# Patient Record
Sex: Female | Born: 1980 | Race: White | Hispanic: No | Marital: Married | Smoking: Never smoker
Health system: Southern US, Community
[De-identification: ages and names within clinical notes are randomized; demographics above are authoritative.]

## PROBLEM LIST (undated history)

## (undated) DIAGNOSIS — K219 Gastro-esophageal reflux disease without esophagitis: Secondary | ICD-10-CM

## (undated) DIAGNOSIS — IMO0002 Reserved for concepts with insufficient information to code with codable children: Secondary | ICD-10-CM

## (undated) DIAGNOSIS — R87619 Unspecified abnormal cytological findings in specimens from cervix uteri: Secondary | ICD-10-CM

## (undated) DIAGNOSIS — K5900 Constipation, unspecified: Secondary | ICD-10-CM

## (undated) DIAGNOSIS — K649 Unspecified hemorrhoids: Secondary | ICD-10-CM

## (undated) DIAGNOSIS — K589 Irritable bowel syndrome without diarrhea: Secondary | ICD-10-CM

## (undated) DIAGNOSIS — B009 Herpesviral infection, unspecified: Secondary | ICD-10-CM

## (undated) HISTORY — PX: HEMORRHOID BANDING: SHX5850

## (undated) HISTORY — PX: COLPOSCOPY: SHX161

## (undated) HISTORY — PX: WISDOM TOOTH EXTRACTION: SHX21

---

## 1999-10-11 ENCOUNTER — Encounter: Admission: RE | Admit: 1999-10-11 | Discharge: 2000-01-09 | Payer: Self-pay | Admitting: Unknown Physician Specialty

## 2000-02-08 ENCOUNTER — Other Ambulatory Visit: Admission: RE | Admit: 2000-02-08 | Discharge: 2000-02-08 | Payer: Self-pay | Admitting: Obstetrics & Gynecology

## 2001-10-17 ENCOUNTER — Other Ambulatory Visit: Admission: RE | Admit: 2001-10-17 | Discharge: 2001-10-17 | Payer: Self-pay | Admitting: Obstetrics & Gynecology

## 2002-10-30 ENCOUNTER — Other Ambulatory Visit: Admission: RE | Admit: 2002-10-30 | Discharge: 2002-10-30 | Payer: Self-pay | Admitting: Obstetrics & Gynecology

## 2003-05-26 ENCOUNTER — Other Ambulatory Visit: Admission: RE | Admit: 2003-05-26 | Discharge: 2003-05-26 | Payer: Self-pay | Admitting: Obstetrics & Gynecology

## 2003-11-14 ENCOUNTER — Other Ambulatory Visit: Admission: RE | Admit: 2003-11-14 | Discharge: 2003-11-14 | Payer: Self-pay | Admitting: Obstetrics & Gynecology

## 2004-11-30 ENCOUNTER — Other Ambulatory Visit: Admission: RE | Admit: 2004-11-30 | Discharge: 2004-11-30 | Payer: Self-pay | Admitting: Obstetrics & Gynecology

## 2006-01-25 ENCOUNTER — Other Ambulatory Visit: Admission: RE | Admit: 2006-01-25 | Discharge: 2006-01-25 | Payer: Self-pay | Admitting: Obstetrics & Gynecology

## 2009-07-10 ENCOUNTER — Inpatient Hospital Stay (HOSPITAL_COMMUNITY): Admission: AD | Admit: 2009-07-10 | Discharge: 2009-07-12 | Payer: Self-pay | Admitting: Obstetrics and Gynecology

## 2011-03-13 LAB — CBC
HCT: 31.5 % — ABNORMAL LOW (ref 36.0–46.0)
HCT: 38 % (ref 36.0–46.0)
Hemoglobin: 13 g/dL (ref 12.0–15.0)
MCHC: 34.3 g/dL (ref 30.0–36.0)
MCV: 100.2 fL — ABNORMAL HIGH (ref 78.0–100.0)
MCV: 101 fL — ABNORMAL HIGH (ref 78.0–100.0)
Platelets: 185 10*3/uL (ref 150–400)
RBC: 3.8 MIL/uL — ABNORMAL LOW (ref 3.87–5.11)
RDW: 11.9 % (ref 11.5–15.5)

## 2011-04-22 NOTE — Discharge Summary (Signed)
Bianca Webster, [place] NO.:  [id number]   MEDICAL RECORD NO.:  [id number]          PATIENT TYPE:  INP   LOCATION:  9142                          FACILITY:  [facility]   PHYSICIAN:  Malva Limes, M.D.    DATE OF BIRTH:  [birth year]   DATE OF ADMISSION:  07/10/2009  DATE OF DISCHARGE:  07/12/2009                               DISCHARGE SUMMARY   FINAL DIAGNOSES:  1. Intrauterine pregnancy at 59 weeks' gestation.  2. Active labor.  3. Episode of fetal bradycardia during the second stage of labor.   PROCEDURE:  Vacuum-assisted vaginal delivery of a female infant with  Apgars of 8 and 9.   DELIVERY PERFORMED:  Malva Limes, M.D.   COMPLICATIONS:  None.   HOSPITAL COURSE:  This 30 year old G1, P0 presents at 38 weeks'  gestation in labor.  The patient's antepartum course up to this point  has been uncomplicated.  The patient was admitted later on the evening  of August 6.  Dr. Dareen Piano was called by the nursing service seconds in  bradycardia.  By the time Dr. Dareen Piano saw the strip, the fetal heart  tones returned to normal.  The patient had AROM, dilated to complete and  complete.  The patient began pushing.  She was having some variable  decelerations with pushing.  At this point, a vacuum extractor was  placed at +3 station.  The patient delivered with the first contraction.  She had the delivery of a 6-pound 5-ounce female infant with Apgars of 8  and 9.  There was a second-degree midline tear that was repaired.  Delivery went without complications.  The patient's postpartum course  was benign without any significant fevers.  The patient was felt ready  for discharge on postpartum day #2.  She was sent home on a regular  diet, told to decrease activities, told to continue her prenatal  vitamins, was sent home with Percocet 1-2 every 4 hours as needed for  pain, and told to continue her vitamins, was to follow up in our office  in 4 weeks.   LABORATORY DATA:  On  discharge, the patient had a hemoglobin of 11.0,  white blood cell count of 15.1. and platelets of 185,000.      [name], P.A.-C.    ______________________________  Malva Limes, M.D.    MB/MEDQ  D:  07/27/2009  T:  07/28/2009  Job:  [id number]

## 2012-09-25 LAB — OB RESULTS CONSOLE ANTIBODY SCREEN: Antibody Screen: NEGATIVE

## 2012-09-25 LAB — OB RESULTS CONSOLE GC/CHLAMYDIA: Chlamydia: NEGATIVE

## 2012-09-25 LAB — OB RESULTS CONSOLE ABO/RH: RH Type: POSITIVE

## 2012-09-25 LAB — OB RESULTS CONSOLE RPR: RPR: NONREACTIVE

## 2012-12-05 NOTE — L&D Delivery Note (Addendum)
Delivery Note At 6:10 AM a viable, healthy and 33 week premature female was delivered via Vaginal, Spontaneous Delivery (Presentation: Occiput Anterior).  APGAR: 8, 9; weight 4 lbs 14 oz..   Placenta status: Intact, Spontaneous, sent to pathology.  Cord:  3 vessels.  Anesthesia: Epidural  Episiotomy: None Lacerations: None, 2 small skin tags excised at patient request. Est. Blood Loss (mL): 300  Mom to postpartum.  Baby to NICU for observation.  Jawanna Dykman D 02/20/2013, 6:30 AM

## 2013-02-19 ENCOUNTER — Inpatient Hospital Stay (HOSPITAL_COMMUNITY)
Admission: AD | Admit: 2013-02-19 | Discharge: 2013-02-22 | DRG: 372 | Disposition: A | Discharge status: Home / Self Care | Payer: BC Managed Care – PPO | Source: Ambulatory Visit | Attending: Obstetrics & Gynecology | Admitting: Obstetrics & Gynecology

## 2013-02-19 ENCOUNTER — Inpatient Hospital Stay (HOSPITAL_COMMUNITY): Payer: BC Managed Care – PPO

## 2013-02-19 ENCOUNTER — Encounter (HOSPITAL_COMMUNITY): Payer: Self-pay | Admitting: *Deleted

## 2013-02-19 DIAGNOSIS — L909 Atrophic disorder of skin, unspecified: Secondary | ICD-10-CM | POA: Diagnosis present

## 2013-02-19 DIAGNOSIS — O4290 Premature rupture of membranes, unspecified as to length of time between rupture and onset of labor, unspecified weeks of gestation: Principal | ICD-10-CM | POA: Diagnosis present

## 2013-02-19 DIAGNOSIS — O99892 Other specified diseases and conditions complicating childbirth: Secondary | ICD-10-CM | POA: Diagnosis present

## 2013-02-19 HISTORY — DX: Reserved for concepts with insufficient information to code with codable children: IMO0002

## 2013-02-19 HISTORY — DX: Unspecified abnormal cytological findings in specimens from cervix uteri: R87.619

## 2013-02-19 HISTORY — DX: Constipation, unspecified: K59.00

## 2013-02-19 HISTORY — DX: Herpesviral infection, unspecified: B00.9

## 2013-02-19 HISTORY — DX: Irritable bowel syndrome, unspecified: K58.9

## 2013-02-19 HISTORY — DX: Unspecified hemorrhoids: K64.9

## 2013-02-19 HISTORY — DX: Gastro-esophageal reflux disease without esophagitis: K21.9

## 2013-02-19 LAB — CBC
HCT: 32.6 % — ABNORMAL LOW (ref 36.0–46.0)
MCH: 33.2 pg (ref 26.0–34.0)
MCV: 99.4 fL (ref 78.0–100.0)
Platelets: 207 10*3/uL (ref 150–400)
RDW: 12.5 % (ref 11.5–15.5)

## 2013-02-19 LAB — OB RESULTS CONSOLE GBS: GBS: NEGATIVE

## 2013-02-19 MED ORDER — DOCUSATE SODIUM 100 MG PO CAPS
100.0000 mg | ORAL_CAPSULE | Freq: Every day | ORAL | Status: DC
  Administered 2013-02-19: 100 mg via ORAL
  Filled 2013-02-19 (×2): qty 1

## 2013-02-19 MED ORDER — TERBUTALINE SULFATE 1 MG/ML IJ SOLN
INTRAMUSCULAR | Status: AC
  Filled 2013-02-19: qty 1

## 2013-02-19 MED ORDER — TERBUTALINE SULFATE 1 MG/ML IJ SOLN
0.2500 mg | Freq: Once | INTRAMUSCULAR | Status: AC
  Administered 2013-02-20: 0.25 mg via SUBCUTANEOUS

## 2013-02-19 MED ORDER — CALCIUM CARBONATE ANTACID 500 MG PO CHEW
2.0000 | CHEWABLE_TABLET | ORAL | Status: DC | PRN
  Filled 2013-02-19: qty 2

## 2013-02-19 MED ORDER — SODIUM CHLORIDE 0.9 % IV SOLN
2.0000 g | Freq: Four times a day (QID) | INTRAVENOUS | Status: DC
  Administered 2013-02-19 – 2013-02-20 (×4): 2 g via INTRAVENOUS
  Filled 2013-02-19 (×6): qty 2000

## 2013-02-19 MED ORDER — BUTORPHANOL TARTRATE 1 MG/ML IJ SOLN
INTRAMUSCULAR | Status: AC
  Filled 2013-02-19: qty 1

## 2013-02-19 MED ORDER — BUTORPHANOL TARTRATE 1 MG/ML IJ SOLN: 1.0000 mg | Freq: Once | INTRAMUSCULAR | Status: DC

## 2013-02-19 MED ORDER — AMOXICILLIN 500 MG PO CAPS: 500.0000 mg | ORAL_CAPSULE | Freq: Three times a day (TID) | ORAL | Status: DC

## 2013-02-19 MED ORDER — LACTATED RINGERS IV SOLN
INTRAVENOUS | Status: DC
  Administered 2013-02-19 – 2013-02-20 (×3): via INTRAVENOUS

## 2013-02-19 MED ORDER — PANTOPRAZOLE SODIUM 40 MG PO TBEC
40.0000 mg | DELAYED_RELEASE_TABLET | Freq: Every day | ORAL | Status: DC
  Administered 2013-02-19: 40 mg via ORAL
  Filled 2013-02-19 (×2): qty 1

## 2013-02-19 MED ORDER — PRENATAL MULTIVITAMIN CH
1.0000 | ORAL_TABLET | Freq: Every day | ORAL | Status: DC
  Administered 2013-02-19: 1 via ORAL
  Filled 2013-02-19 (×2): qty 1

## 2013-02-19 MED ORDER — ACETAMINOPHEN 325 MG PO TABS: 650.0000 mg | ORAL_TABLET | ORAL | Status: DC | PRN

## 2013-02-19 MED ORDER — BETAMETHASONE SOD PHOS & ACET 6 (3-3) MG/ML IJ SUSP
12.0000 mg | INTRAMUSCULAR | Status: DC
  Administered 2013-02-19: 12 mg via INTRAMUSCULAR
  Filled 2013-02-19 (×2): qty 2

## 2013-02-19 MED ORDER — ZOLPIDEM TARTRATE 5 MG PO TABS
5.0000 mg | ORAL_TABLET | Freq: Every evening | ORAL | Status: DC | PRN
  Administered 2013-02-19: 5 mg via ORAL
  Filled 2013-02-19: qty 1

## 2013-02-19 NOTE — MAU Provider Note (Signed)
This is a 32 y.o. female at [redacted]w[redacted]d who presents with c/o leaking fluid off and on since 2030 last evening.   See RN notes.  I was asked to do a speculum exam for rule out rupture of membranes  Fetal heart rate pattern is reactive, + accels, good variability, Category I  Irregular mild contractions  Speculum exam:  Small amount of pooling, clear liquid                               + ferning  Cervix 1/40%/-3/ uncertain presenting part

## 2013-02-19 NOTE — MAU Note (Signed)
Episode of cramping followed by small gush of fluid at 2030 last night.  Small amt of fluids continued during the night, pinkish bloody mucous around 0600.  Few twinges in lower abd this morning.

## 2013-02-19 NOTE — H&P (Signed)
32 y.o. G2P1  Estimated Date of Delivery: 04/09/13 admitted at [redacted] weeks gestation with PPROM.  Patient noted small amount of watery leakage at 2000 on 3/17.  Presented to MAU this morning where fern positive fluid was noted in the vagina.  Ultrasound showed vertex presentation and AFI of 10 cm.  Patient continues to leak while in MAU.    Prenatal Transfer Tool  Maternal Diabetes: No Genetic Screening: Normal Maternal Ultrasounds/Referrals: Normal Fetal Ultrasounds or other Referrals:  None Maternal Substance Abuse:  No Significant Maternal Medications:  None Significant Maternal Lab Results: None Other Significant Pregnancy Complications:  Primary HSV 1 vulvar lesions noted at 19 weeks.  No further lesions noted by patient.  Afebrile, VSS Heart and Lungs: No active disease Abdomen: soft, gravid, EFW AGA. Cervical exam:  1/40  Impression: PPROM  Plan:  Admit for IV antibiotics, steroids, observation.  Consider induction at 34 weeks if appropriate.  Will start Valtrex prophylaxis.

## 2013-02-20 ENCOUNTER — Encounter (HOSPITAL_COMMUNITY): Payer: Self-pay | Admitting: Anesthesiology

## 2013-02-20 ENCOUNTER — Encounter (HOSPITAL_COMMUNITY): Payer: Self-pay | Admitting: Family Medicine

## 2013-02-20 ENCOUNTER — Inpatient Hospital Stay (HOSPITAL_COMMUNITY): Payer: BC Managed Care – PPO | Admitting: Anesthesiology

## 2013-02-20 MED ORDER — LACTATED RINGERS IV SOLN
500.0000 mL | Freq: Once | INTRAVENOUS | Status: AC
  Administered 2013-02-20: 500 mL via INTRAVENOUS

## 2013-02-20 MED ORDER — EPHEDRINE 5 MG/ML INJ: 10.0000 mg | INTRAVENOUS | Status: DC | PRN

## 2013-02-20 MED ORDER — DIPHENHYDRAMINE HCL 25 MG PO CAPS: 25.0000 mg | ORAL_CAPSULE | Freq: Four times a day (QID) | ORAL | Status: DC | PRN

## 2013-02-20 MED ORDER — TETANUS-DIPHTH-ACELL PERTUSSIS 5-2.5-18.5 LF-MCG/0.5 IM SUSP
0.5000 mL | Freq: Once | INTRAMUSCULAR | Status: AC
  Administered 2013-02-21: 0.5 mL via INTRAMUSCULAR
  Filled 2013-02-20: qty 0.5

## 2013-02-20 MED ORDER — ZOLPIDEM TARTRATE 5 MG PO TABS: 5.0000 mg | ORAL_TABLET | Freq: Every evening | ORAL | Status: DC | PRN

## 2013-02-20 MED ORDER — IBUPROFEN 600 MG PO TABS
600.0000 mg | ORAL_TABLET | Freq: Four times a day (QID) | ORAL | Status: DC
  Administered 2013-02-20 – 2013-02-22 (×10): 600 mg via ORAL
  Filled 2013-02-20 (×10): qty 1

## 2013-02-20 MED ORDER — EPHEDRINE 5 MG/ML INJ
10.0000 mg | INTRAVENOUS | Status: DC | PRN
  Filled 2013-02-20: qty 4

## 2013-02-20 MED ORDER — LACTATED RINGERS IV SOLN: 500.0000 mL | INTRAVENOUS | Status: DC | PRN

## 2013-02-20 MED ORDER — BENZOCAINE-MENTHOL 20-0.5 % EX AERO
1.0000 "application " | INHALATION_SPRAY | CUTANEOUS | Status: DC | PRN
  Administered 2013-02-20 – 2013-02-22 (×2): 1 via TOPICAL
  Filled 2013-02-20 (×2): qty 56

## 2013-02-20 MED ORDER — IBUPROFEN 600 MG PO TABS: 600.0000 mg | ORAL_TABLET | Freq: Four times a day (QID) | ORAL | Status: DC | PRN

## 2013-02-20 MED ORDER — WITCH HAZEL-GLYCERIN EX PADS: 1.0000 "application " | MEDICATED_PAD | CUTANEOUS | Status: DC | PRN

## 2013-02-20 MED ORDER — LANOLIN HYDROUS EX OINT: TOPICAL_OINTMENT | CUTANEOUS | Status: DC | PRN

## 2013-02-20 MED ORDER — OXYTOCIN BOLUS FROM INFUSION
500.0000 mL | INTRAVENOUS | Status: DC
  Administered 2013-02-20: 500 mL via INTRAVENOUS

## 2013-02-20 MED ORDER — SILVER NITRATE-POT NITRATE 75-25 % EX MISC
CUTANEOUS | Status: AC
  Filled 2013-02-20: qty 10

## 2013-02-20 MED ORDER — FENTANYL 2.5 MCG/ML BUPIVACAINE 1/10 % EPIDURAL INFUSION (WH - ANES)
INTRAMUSCULAR | Status: DC | PRN
  Administered 2013-02-20: 14 mL/h via EPIDURAL

## 2013-02-20 MED ORDER — LIDOCAINE HCL (PF) 1 % IJ SOLN
INTRAMUSCULAR | Status: DC | PRN
  Administered 2013-02-20 (×2): 8 mL

## 2013-02-20 MED ORDER — DIBUCAINE 1 % RE OINT
1.0000 "application " | TOPICAL_OINTMENT | RECTAL | Status: DC | PRN
  Administered 2013-02-20: 1 via RECTAL
  Filled 2013-02-20: qty 28

## 2013-02-20 MED ORDER — ACETAMINOPHEN 325 MG PO TABS: 650.0000 mg | ORAL_TABLET | ORAL | Status: DC | PRN

## 2013-02-20 MED ORDER — LIDOCAINE HCL (PF) 1 % IJ SOLN
30.0000 mL | INTRAMUSCULAR | Status: DC | PRN
  Filled 2013-02-20: qty 30

## 2013-02-20 MED ORDER — CITRIC ACID-SODIUM CITRATE 334-500 MG/5ML PO SOLN: 30.0000 mL | ORAL | Status: DC | PRN

## 2013-02-20 MED ORDER — PHENYLEPHRINE 40 MCG/ML (10ML) SYRINGE FOR IV PUSH (FOR BLOOD PRESSURE SUPPORT)
80.0000 ug | PREFILLED_SYRINGE | INTRAVENOUS | Status: DC | PRN
  Filled 2013-02-20: qty 5

## 2013-02-20 MED ORDER — PHENYLEPHRINE 40 MCG/ML (10ML) SYRINGE FOR IV PUSH (FOR BLOOD PRESSURE SUPPORT): 80.0000 ug | PREFILLED_SYRINGE | INTRAVENOUS | Status: DC | PRN

## 2013-02-20 MED ORDER — PRENATAL MULTIVITAMIN CH
1.0000 | ORAL_TABLET | Freq: Every day | ORAL | Status: DC
  Administered 2013-02-20 – 2013-02-21 (×2): 1 via ORAL
  Filled 2013-02-20 (×2): qty 1

## 2013-02-20 MED ORDER — ONDANSETRON HCL 4 MG PO TABS: 4.0000 mg | ORAL_TABLET | ORAL | Status: DC | PRN

## 2013-02-20 MED ORDER — OXYCODONE-ACETAMINOPHEN 5-325 MG PO TABS
1.0000 | ORAL_TABLET | ORAL | Status: DC | PRN
  Administered 2013-02-20: 1 via ORAL
  Administered 2013-02-20 – 2013-02-22 (×6): 2 via ORAL
  Filled 2013-02-20 (×3): qty 2
  Filled 2013-02-20: qty 1
  Filled 2013-02-20 (×2): qty 2
  Filled 2013-02-20 (×2): qty 1

## 2013-02-20 MED ORDER — SENNOSIDES-DOCUSATE SODIUM 8.6-50 MG PO TABS
2.0000 | ORAL_TABLET | Freq: Every day | ORAL | Status: DC
  Administered 2013-02-20 – 2013-02-21 (×2): 2 via ORAL

## 2013-02-20 MED ORDER — ONDANSETRON HCL 4 MG/2ML IJ SOLN: 4.0000 mg | Freq: Four times a day (QID) | INTRAMUSCULAR | Status: DC | PRN

## 2013-02-20 MED ORDER — LACTATED RINGERS IV SOLN: INTRAVENOUS | Status: DC

## 2013-02-20 MED ORDER — BUTORPHANOL TARTRATE 1 MG/ML IJ SOLN: 1.0000 mg | INTRAMUSCULAR | Status: DC | PRN

## 2013-02-20 MED ORDER — ONDANSETRON HCL 4 MG/2ML IJ SOLN: 4.0000 mg | INTRAMUSCULAR | Status: DC | PRN

## 2013-02-20 MED ORDER — OXYTOCIN 40 UNITS IN LACTATED RINGERS INFUSION - SIMPLE MED
62.5000 mL/h | INTRAVENOUS | Status: DC
  Administered 2013-02-20: 62.5 mL/h via INTRAVENOUS
  Filled 2013-02-20: qty 1000

## 2013-02-20 MED ORDER — DIPHENHYDRAMINE HCL 50 MG/ML IJ SOLN: 12.5000 mg | INTRAMUSCULAR | Status: DC | PRN

## 2013-02-20 MED ORDER — FENTANYL 2.5 MCG/ML BUPIVACAINE 1/10 % EPIDURAL INFUSION (WH - ANES)
14.0000 mL/h | INTRAMUSCULAR | Status: DC | PRN
  Filled 2013-02-20: qty 125

## 2013-02-20 MED ORDER — SIMETHICONE 80 MG PO CHEW: 80.0000 mg | CHEWABLE_TABLET | ORAL | Status: DC | PRN

## 2013-02-20 MED ORDER — OXYCODONE-ACETAMINOPHEN 5-325 MG PO TABS: 1.0000 | ORAL_TABLET | ORAL | Status: DC | PRN

## 2013-02-20 NOTE — Progress Notes (Signed)
Transferred to L&D via bed and personal belongings.

## 2013-02-20 NOTE — Progress Notes (Signed)
TC to Dr Arlyce Dice to report UC's frequency and intensity per palpation and toco and pt's request for pain med.  Orders given with intructions for RN to perform SVE after medicated if medical interventions ineffective in stopping UC's and complaint of pain .  If pt laboring transfer to L&D

## 2013-02-20 NOTE — Anesthesia Postprocedure Evaluation (Signed)
Anesthesia Post Note  Patient: Bianca Webster  Procedure(s) Performed: * No procedures listed *  Anesthesia type: Epidural  Patient location: Mother/Baby  Post pain: Pain level controlled  Post assessment: Post-op Vital signs reviewed  Last Vitals: BP 111/68  Pulse 82  Temp(Src) 36.8 C (Oral)  Resp 20  Ht 5\' 6"  (1.676 m)  Wt 164 lb (74.39 kg)  BMI 26.48 kg/m2  SpO2 98%  Post vital signs: Reviewed  Level of consciousness: awake  Complications: No apparent anesthesia complications

## 2013-02-20 NOTE — Anesthesia Postprocedure Evaluation (Signed)
Anesthesia Post Note  Patient: Bianca Webster  Procedure(s) Performed: * No procedures listed *  Anesthesia type: Epidural  Patient location: Mother/Baby  Post pain: Pain level controlled  Post assessment: Post-op Vital signs reviewed  Last Vitals:  Filed Vitals:   02/20/13 1300  BP: 111/68  Pulse: 82  Temp: 36.8 C  Resp: 20    Post vital signs: Reviewed  Level of consciousness:alert  Complications: No apparent anesthesia complications

## 2013-02-20 NOTE — Anesthesia Procedure Notes (Signed)
Epidural Patient location during procedure: OB Start time: 02/20/2013 2:45 AM End time: 02/20/2013 2:49 AM  Staffing Anesthesiologist: Sandrea Hughs Performed by: anesthesiologist   Preanesthetic Checklist Completed: patient identified, site marked, surgical consent, pre-op evaluation, timeout performed, IV checked, risks and benefits discussed and monitors and equipment checked  Epidural Patient position: sitting Prep: site prepped and draped and DuraPrep Patient monitoring: continuous pulse ox and blood pressure Approach: midline Injection technique: LOR air  Needle:  Needle type: Tuohy  Needle gauge: 17 G Needle length: 9 cm and 9 Needle insertion depth: 4 cm Catheter type: closed end flexible Catheter size: 19 Gauge Catheter at skin depth: 8 cm Test dose: negative and Other  Assessment Sensory level: T9 Events: blood not aspirated, injection not painful, no injection resistance, negative IV test and no paresthesia  Additional Notes Reason for block:procedure for pain

## 2013-02-20 NOTE — Progress Notes (Signed)
Patient has entered labor.  Cervix reported as 3/80 with painful regular contractions.  Will transfer to L&D.

## 2013-02-20 NOTE — Anesthesia Preprocedure Evaluation (Signed)
Anesthesia Evaluation  Patient identified by MRN, date of birth, ID band Patient awake    Reviewed: Allergy & Precautions, H&P , NPO status , Patient's Chart, lab work & pertinent test results  Airway Mallampati: I TM Distance: >3 FB Neck ROM: full    Dental no notable dental hx.    Pulmonary neg pulmonary ROS,    Pulmonary exam normal       Cardiovascular negative cardio ROS      Neuro/Psych negative neurological ROS  negative psych ROS   GI/Hepatic Neg liver ROS, GERD-  Medicated and Controlled,  Endo/Other  negative endocrine ROS  Renal/GU negative Renal ROS  negative genitourinary   Musculoskeletal negative musculoskeletal ROS (+)   Abdominal Normal abdominal exam  (+)   Peds negative pediatric ROS (+)  Hematology negative hematology ROS (+)   Anesthesia Other Findings   Reproductive/Obstetrics (+) Pregnancy                           Anesthesia Physical Anesthesia Plan  ASA: II  Anesthesia Plan: Epidural   Post-op Pain Management:    Induction:   Airway Management Planned:   Additional Equipment:   Intra-op Plan:   Post-operative Plan:   Informed Consent: I have reviewed the patients History and Physical, chart, labs and discussed the procedure including the risks, benefits and alternatives for the proposed anesthesia with the patient or authorized representative who has indicated his/her understanding and acceptance.     Plan Discussed with:   Anesthesia Plan Comments:         Anesthesia Quick Evaluation

## 2013-02-21 LAB — CBC
HCT: 28.9 % — ABNORMAL LOW (ref 36.0–46.0)
MCH: 34 pg (ref 26.0–34.0)
MCHC: 34.3 g/dL (ref 30.0–36.0)
MCV: 99.3 fL (ref 78.0–100.0)
Platelets: 193 10*3/uL (ref 150–400)
RDW: 12.4 % (ref 11.5–15.5)
WBC: 12.5 10*3/uL — ABNORMAL HIGH (ref 4.0–10.5)

## 2013-02-21 MED ORDER — HYDROCORTISONE ACE-PRAMOXINE 1-1 % RE FOAM
1.0000 | Freq: Three times a day (TID) | RECTAL | Status: DC
  Administered 2013-02-21 – 2013-02-22 (×3): 1 via RECTAL
  Filled 2013-02-21 (×2): qty 10

## 2013-02-21 NOTE — Progress Notes (Signed)
Post Partum Day 1 Subjective: up ad lib, voiding, tolerating PO, + flatus and c/o hemorrhoids  Objective: Blood pressure 108/65, pulse 75, temperature 97.8 F (36.6 C), temperature source Oral, resp. rate 18, height 5\' 6"  (1.676 m), weight 74.39 kg (164 lb), SpO2 100.00%, unknown if currently breastfeeding.  Physical Exam:  General: alert, cooperative and appears stated age Lochia: appropriate Uterine Fundus: firm   Recent Labs  02/19/13 1005 02/21/13 0535  HGB 10.9* 9.9*  HCT 32.6* 28.9*    Assessment/Plan: Breastfeeding Baby in NICU, doing well Will rx proctofoam   LOS: 2 days   Bianca Webster H. 02/21/2013, 10:35 AM

## 2013-02-21 NOTE — Progress Notes (Signed)
CSW attempted to meet with MOB to complete assessment for NICU admission, but she was not in her room at this time.  CSW will attempt again at a later time.  MOB's medical record reviewed.  No social concerns noted.

## 2013-02-22 NOTE — Progress Notes (Signed)
Patient is eating, ambulating, voiding.  Pain control is good.  Filed Vitals:   02/20/13 2131 02/21/13 0554 02/21/13 1200 02/21/13 2126  BP:  108/65 102/69 102/61  Pulse: 75 75 73 74  Temp: 97.4 F (36.3 C) 97.8 F (36.6 C) 97.7 F (36.5 C) 98.1 F (36.7 C)  TempSrc: Oral Oral Oral Oral  Resp: 19 18 18 18   Height:      Weight:      SpO2: 99% 100% 98% 96%    Fundus firm Perineum without swelling.  Lab Results  Component Value Date   WBC 12.5* 02/21/2013   HGB 9.9* 02/21/2013   HCT 28.9* 02/21/2013   MCV 99.3 02/21/2013   PLT 193 02/21/2013    A/Positive/-- (10/22 0000)/RI  A/P Post partum day 2.  Routine care.  Expect d/c today.  Baby doing well in NICU, preterm.  Keitha Kolk A

## 2013-02-22 NOTE — Discharge Summary (Signed)
Obstetric Discharge Summary Reason for Admission: rupture of membranes Prenatal Procedures: none Intrapartum Procedures: spontaneous vaginal delivery Postpartum Procedures: none Complications-Operative and Postpartum: none Hemoglobin  Date Value Range Status  02/21/2013 9.9* 12.0 - 15.0 g/dL Final     HCT  Date Value Range Status  02/21/2013 28.9* 36.0 - 46.0 % Final     Discharge Diagnoses: preterm premature rupture of membranes, preterm labor  Discharge Information: Date: 02/22/2013 Activity: pelvic rest Diet: routine Medications: Ibuprofen Condition: stable Instructions: refer to practice specific booklet Discharge to: home   Newborn Data: Live born female  Birth Weight: 4 lb 14 oz (2210 g) APGAR: 8, 9  Home with doing well in NICU for prematurity.  Dudley Mages A 02/22/2013, 7:56 AM

## 2013-03-26 ENCOUNTER — Other Ambulatory Visit: Payer: Self-pay

## 2014-10-06 ENCOUNTER — Encounter (HOSPITAL_COMMUNITY): Payer: Self-pay | Admitting: Family Medicine

## 2017-12-01 ENCOUNTER — Ambulatory Visit: Payer: BC Managed Care – PPO | Admitting: Physician Assistant

## 2017-12-01 ENCOUNTER — Encounter: Payer: Self-pay | Admitting: Physician Assistant

## 2017-12-01 ENCOUNTER — Other Ambulatory Visit: Payer: Self-pay

## 2017-12-01 VITALS — BP 104/62 | HR 90 | Temp 98.8°F | Resp 18 | Ht 66.0 in | Wt 128.2 lb

## 2017-12-01 DIAGNOSIS — J029 Acute pharyngitis, unspecified: Secondary | ICD-10-CM

## 2017-12-01 LAB — POCT RAPID STREP A (OFFICE): Rapid Strep A Screen: NEGATIVE

## 2017-12-01 MED ORDER — METHYLPREDNISOLONE ACETATE 80 MG/ML IJ SUSP
80.0000 mg | Freq: Once | INTRAMUSCULAR | Status: AC
  Administered 2017-12-01: 80 mg via INTRAMUSCULAR

## 2017-12-01 MED ORDER — AMOXICILLIN-POT CLAVULANATE 875-125 MG PO TABS: 1.0000 | ORAL_TABLET | Freq: Two times a day (BID) | ORAL | 0 refills | Status: AC

## 2017-12-01 NOTE — Progress Notes (Signed)
   Bianca LyKristin G Webster  MRN: [id number] DOB: 06/13/1981  PCP: Patient, No Pcp Per  Subjective:  Pt is a 36 year old female who presents to clinic for worsening sore throat x 1 week. Pain is 8/10 and is right-sided. Felt like sore throat was improving until about 3 days ago, then significantly worsened. Swollen tonsils with "sharp and throbbing" pain, pain is worse at night. Pain and occasional difficulty with swallowing. Endorses voice change. She has taken benzocaine lozenges, throat sprays, aleve, ibuprofen, salt water gargles. She is staying well hydrated. Denies cough, fever, chills, difficulty breathing, drooling, n/v.    Review of Systems  Constitutional: Negative for chills, diaphoresis, fatigue and fever.  HENT: Positive for congestion and sore throat. Negative for postnasal drip, rhinorrhea, sinus pressure and sinus pain.   Respiratory: Negative for cough, shortness of breath and wheezing.   Psychiatric/Behavioral: Positive for sleep disturbance.    There are no active problems to display for this patient.   Current Outpatient Medications on File Prior to Visit  Medication Sig Dispense Refill  . hydrocortisone 2.5 % cream Apply topically 2 (two) times daily.    Marland Kitchen. omeprazole (PRILOSEC OTC) 20 MG tablet Take 20 mg by mouth daily.    . Prenatal Vit-Fe Fumarate-FA (PRENATAL MULTIVITAMIN) TABS Take 1 tablet by mouth daily at 12 noon.     No current facility-administered medications on file prior to visit.     No Known Allergies   Objective:  BP 104/62   Pulse 90   Temp 98.8 F (37.1 C) (Oral)   Resp 18   Ht 5\' 6"  (1.676 m)   Wt 128 lb 3.2 oz (58.2 kg)   SpO2 100%   BMI 20.69 kg/m   Physical Exam  Constitutional: She is oriented to person, place, and time and well-developed, well-nourished, and in no distress. No distress.  HENT:  Right Ear: Tympanic membrane normal.  Left Ear: Tympanic membrane normal.  Nose: Mucosal edema present. No rhinorrhea. Right sinus exhibits no  maxillary sinus tenderness and no frontal sinus tenderness. Left sinus exhibits no maxillary sinus tenderness and no frontal sinus tenderness.  Mouth/Throat: Uvula is midline and mucous membranes are normal. No uvula swelling. Posterior oropharyngeal erythema present. No oropharyngeal exudate, posterior oropharyngeal edema or tonsillar abscesses.  Cardiovascular: Normal rate, regular rhythm and normal heart sounds.  Pulmonary/Chest: Effort normal and breath sounds normal. No respiratory distress. She has no wheezes. She has no rales.  Neurological: She is alert and oriented to person, place, and time. GCS score is 15.  Skin: Skin is warm and dry.  Psychiatric: Mood, memory, affect and judgment normal.  Vitals reviewed.  Results for orders placed or performed in visit on 12/01/17  POCT rapid strep A  Result Value Ref Range   Rapid Strep A Screen Negative Negative    Assessment and Plan :  1. Acute pharyngitis, unspecified etiology - amoxicillin-clavulanate (AUGMENTIN) 875-125 MG tablet; Take 1 tablet by mouth 2 (two) times daily for 14 days.  Dispense: 28 tablet; Refill: 0  2. Sore throat - POCT rapid strep A - Culture, Group A Strep - methylPREDNISolone acetate (DEPO-MEDROL) injection 80 mg - Pt c/o one-sided worsening sore throat x 1 week. Rapid strep is negative. Culture is pending.  Suspect false negative vs abscess. Plan to treat due to HPI. RTC if no improvement. Supportive care discussed.   Marco CollieWhitney Daina Cara, PA-C  [facility] 12/01/2017 8:33 AM

## 2017-12-01 NOTE — Patient Instructions (Addendum)
   Stay well hydrated. Get lost of rest. Come back if you are not improving or if your symptoms worsen.   Advil or ibuprofen for pain. Do not take Aspirin.  Cepacol throat lozenges Drink enough water and fluids to keep your urine clear or pale yellow. For sore throat: ? Gargle with 8 oz of salt water ( tsp of salt per 1 qt of water) as often as every 1-2 hours to soothe your throat.  Gargle liquid benadryl.  Use Elderberry syrup.   For sore throat try using a honey-based tea. Use 3 teaspoons of honey with juice squeezed from half lemon. Place shaved pieces of ginger into 1/2-1 cup of water and warm over stove top. Then mix the ingredients and repeat every 4 hours as needed.  Cough Syrup Recipe: Sweet Lemon & Honey Thyme  Ingredients a handful of fresh thyme sprigs   1 pint of water (2 cups)  1/2 cup honey (raw is best, but regular will do)  1/2 lemon chopped Instructions 1. Place the lemon in the pint jar and cover with the honey. The honey will macerate the lemons and draw out liquids which taste so delicious! 2. Meanwhile, toss the thyme leaves into a saucepan and cover them with the water. 3. Bring the water to a gentle simmer and reduce it to half, about a cup of tea. 4. When the tea is reduced and cooled a bit, strain the sprigs & leaves, add it into the pint jar and stir it well. 5. Give it a shake and use a spoonful as needed. 6. Store your homemade cough syrup in the refrigerator for about a month.  Thank you for coming in today. I hope you feel we met your needs.  Feel free to call PCP if you have any questions or further requests.  Please consider signing up for MyChart if you do not already have it, as this is a great way to communicate with me.  Best,  [employer], PA-C

## 2017-12-03 LAB — CULTURE, GROUP A STREP: Strep A Culture: POSITIVE — AB

## 2019-04-07 ENCOUNTER — Telehealth: Payer: BC Managed Care – PPO | Admitting: Family

## 2019-04-07 DIAGNOSIS — L255 Unspecified contact dermatitis due to plants, except food: Secondary | ICD-10-CM

## 2019-04-07 MED ORDER — PREDNISONE 10 MG (21) PO TBPK: ORAL_TABLET | ORAL | 0 refills | Status: DC

## 2019-04-07 NOTE — Progress Notes (Signed)

## 2019-04-19 ENCOUNTER — Telehealth: Payer: BC Managed Care – PPO | Admitting: Family

## 2019-04-19 DIAGNOSIS — L255 Unspecified contact dermatitis due to plants, except food: Secondary | ICD-10-CM | POA: Diagnosis not present

## 2019-04-19 MED ORDER — PREDNISONE 10 MG (21) PO TBPK: ORAL_TABLET | ORAL | 0 refills | Status: DC

## 2019-04-19 NOTE — Progress Notes (Signed)
Greater than 5 minutes, yet less than 10 minutes of time have been spent researching, coordinating, and implementing care for this patient today.  Thank you for the details you included in the comment boxes. Those details are very helpful in determining the best course of treatment for you and help [place] to provide the best care.  Just to be safe, I would wash everything. As far as the cream, the prednisone should help tremendously along with calamine lotion. The hydrocortisone won't be necessary with the prednisone present.   We are sorry that you are not feeing well.  Here is how we plan to help!  Based on what you have shared with me it looks like you have had an allergic reaction to the oily resin from a group of plants.  This resin is very sticky, so it easily attaches to your skin, clothing, tools equipment, and pet's fur.    This blistering rash is often called poison ivy rash although it can come from contact with the leaves, stems and roots of poison ivy, poison oak and poison sumac.  The oily resin contains urushiol (u-ROO-she-ol) that produces a skin rash on exposed skin.  The severity of the rash depends on the amount of urushiol that gets on your skin.  A section of skin with more urushiol on it may develop a rash sooner.  The rash usually develops 12-48 hours after exposure and can last two to three weeks.  Your skin must come in direct contact with the plant's oil to be affected.  Blister fluid doesn't spread the rash.  However, if you come into contact with a piece of clothing or pet fur that has urushiol on it, the rash may spread out.  You can also transfer the oil to other parts of your body with your fingers.  Often the rash looks like a straight line because of the way the plant brushes against your skin.    I have developed the following plan to treat your condition Since your rash is widespread or has resulted in a large number of blisters, I have prescribed an oral  corticosteroid.  Please follow these recommendations:  I have sent a prednisone dose pack to your chosen pharmacy. Be sure to follow the instructions carefully and complete the entire prescription. You may use Benadryl or Caladryl topical lotions to sooth the itch and remember cool, not hot, showers and baths can help relieve the itching!  Place cool, wet compresses on the affected area for 15-30 minutes several times a day.  You may also take oral antihistamines, such as diphenhydramine (Benadryl, others), which may also help you sleep better.  Watch your skin for any purulent (pus) drainage or red streaking from the site.  If this occurs, contact your provider.  You may require an antibiotic for a skin infection.  Make sure that the clothes you were wearing as well as any towels or sheets that may have come in contact with the oil (urushiol) are washed in detergent and hot water.         What can you do to prevent this rash?  Avoid the plants.  Learn how to identify poison ivy, poison oak and poison sumac in all seasons.  When hiking or engaging in other activities that might expose you to these plants, try to stay on cleared pathways.  If camping, make sure you pitch your tent in an area free of these plants.  Keep pets from running through wooded areas so  that urushiol doesn't accidentally stick to their fur, which you may touch.  Remove or kill the plants.  In your yard, you can get rid of poison ivy by applying an herbicide or pulling it out of the ground, including the roots, while wearing heavy gloves.  Afterward remove the gloves and thoroughly wash them and your hands.  Don't burn poison ivy or related plants because the urushiol can be carried by smoke.  Wear protective clothing.  If needed, protect your skin by wearing socks, boots, pants, long sleeves and vinyl gloves.  Wash your skin right away.  Washing off the oil with soap and water within 30 minutes of exposure may reduce your chances of  getting a poison ivy rash.  Even washing after an hour or so can help reduce the severity of the rash.  If you walk through some poison ivy and then later touch your shoes, you may get some urushiol on your hands, which may then transfer to your face or body by touching or rubbing.  If the contaminated object isn't cleaned, the urushiol on it can still cause a skin reaction years later.    Be careful not to reuse towels after you have washed your skin.  Also carefully wash clothing in detergent and hot water to remove all traces of the oil.  Handle contaminated clothing carefully so you don't transfer the urushiol to yourself, furniture, rugs or appliances.  Remember that pets can carry the oil on their fur and paws.  If you think your pet may be contaminated with urushiol, put on some long rubber gloves and give your pet a bath.  Finally, be careful not to burn these plants as the smoke can contain traces of the oil.  Inhaling the smoke may result in difficulty breathing. If that occurred you should see a physician as soon as possible.  See your doctor right away if:   The reaction is severe or widespread  You inhaled the smoke from burning poison ivy and are having difficulty breathing  Your skin continues to swell  The rash affects your eyes, mouth or genitals  Blisters are oozing pus  You develop a fever greater than 100 F (37.8 C)  The rash doesn't get better within a few weeks.  If you scratch the poison ivy rash, bacteria under your fingernails may cause the skin to become infected.  See your doctor if pus starts oozing from the blisters.  Treatment generally includes antibiotics.  Poison ivy treatments are usually limited to self-care methods.  And the rash typically goes away on its own in two to three weeks.     If the rash is widespread or results in a large number of blisters, your doctor may prescribe an oral corticosteroid, such as prednisone.  If a bacterial infection  has developed at the rash site, your doctor may give you a prescription for an oral antibiotic.  MAKE SURE YOU   Understand these instructions.  Will watch your condition.  Will get help right away if you are not doing well or get worse.  Thank you for choosing an e-visit. Your e-visit answers were reviewed by a board certified advanced clinical practitioner to complete your personal care plan. Depending upon the condition, your plan could have included both over the counter or prescription medications.  Please review your pharmacy choice. If there is a problem you may use MyChart messaging to have the prescription routed to another pharmacy.   Your safety is important  to [place]. If you have drug allergies check your prescription carefully.  You can use MyChart to ask questions about today's visit, request a non-urgent call back, or ask for a work or school excuse for 24 hours related to this e-Visit. If it has been greater than 24 hours you will need to follow up with your provider, or enter a new e-Visit to address those concerns.   You will get an email in the next two days asking about your experience. I hope that your e-visit has been valuable and will speed your recovery Thank you for choosing an e-visit.

## 2019-04-27 ENCOUNTER — Telehealth: Payer: BC Managed Care – PPO | Admitting: Physician Assistant

## 2019-04-27 DIAGNOSIS — L255 Unspecified contact dermatitis due to plants, except food: Secondary | ICD-10-CM

## 2019-04-27 NOTE — Progress Notes (Signed)
Based on what you shared with me, I feel your condition warrants further evaluation and I recommend that you be seen for a face to face office visit.  Giving recurrence and severity of symptoms despite prior treatments via e-visit, you will need to be seen for further management. I would recommend a steroid shot along with an extended steroid taper (at least 12 days) to prevent recurrent dermatitis which can happen if steroids are stopped too soon. I recommend you be seen at your local Urgent care to get a shot to calm this down more quickly and for further assessment/management of this. The kitty could be the culprit or it could be something else so this needs to be evaluated.    NOTE: If you entered your credit card information for this eVisit, you will not be charged. You may see a "hold" on your card for the $35 but that hold will drop off and you will not have a charge processed.  If you are having a true medical emergency please call 911.  If you need an urgent face to face visit, [facility] has four urgent care centers for your convenience.    PLEASE NOTE: THE [facility] LOCATIONS AND URGENT CARE CLINICS DO NOT HAVE THE TESTING FOR CORONAVIRUS COVID19 AVAILABLE.  IF YOU FEEL YOU NEED THIS TEST YOU MUST GO TO A TRIAGE LOCATION AT ONE OF THE HOSPITAL EMERGENCY DEPARTMENTS   [email or web address] to reserve your spot online an avoid wait times  [facility] [address] [place], [place] [address] Modified hours of operation: Monday-Friday, 12 PM to 6 PM  Saturday & Sunday 10 AM to 4 PM *Across the street from [employer]  [facility] (New Address!) [address], [address] [place], [place] [address] *Just off [employer], across the road from [place]* Modified hours of operation: Monday-Friday, 12 PM to 6 PM  Closed Saturday & Sunday  [facility]'s modified hours of operation will be in effect from May 1 until May 31   The  following sites will take your insurance:  . [facility]  [phone] Get Driving Directions Find a Provider at this Location  [address] [place], [place] [address] . 10 am to 8 pm Monday-Friday . 12 pm to 8 pm Saturday-Sunday   . [facility] at [facility]  [phone] Get Driving Directions Find a Provider at this Location  [address] [place], [place] [address] . 8 am to 8 pm Monday-Friday . 9 am to 6 pm Saturday . 11 am to 6 pm Sunday   . [facility] at [facility]  [phone] Get Driving Directions  [address] [place], [place] [address] . 8 am to 8 pm Monday-Friday . 8 am to 4 pm Saturday-Sunday   Your e-visit answers were reviewed by a board certified advanced clinical practitioner to complete your personal care plan.  Thank you for using e-Visits.

## 2019-08-07 ENCOUNTER — Telehealth: Payer: Self-pay

## 2019-08-07 NOTE — Telephone Encounter (Signed)
Sure that is ok

## 2019-08-07 NOTE — Telephone Encounter (Signed)
Copied from [place] [id number]. Topic: Appointment Scheduling - Scheduling Inquiry for Clinic >> Aug 07, 2019  9:50 AM Rayann Heman wrote: Reason for CRM: pt called and stated that she has seen Jessica Copland in the past and would like to know if she would accept her as a patient.

## 2019-08-07 NOTE — Telephone Encounter (Signed)
Kirsten, could you please get this patient scheduled for new patient appointment with dr. Lorelei Pont

## 2019-08-14 ENCOUNTER — Encounter: Payer: Self-pay | Admitting: Family Medicine

## 2019-08-15 NOTE — Telephone Encounter (Signed)
NP appt scheduled

## 2019-08-29 ENCOUNTER — Ambulatory Visit: Payer: BC Managed Care – PPO | Admitting: Family Medicine

## 2019-09-04 NOTE — Progress Notes (Addendum)
[facility] [address], [address] [place], [place] [address] [phone]  Date:  09/05/2019   Name:  Bianca Webster   DOB:  July 25, 1981   MRN:  [id number]  PCP:  Pearline Cables, MD    Chief Complaint: New Patient (Initial Visit) (fatigue all summer-mono? ) and Urinary Tract Infection   History of Present Illness:  Bianca Webster is a 38 y.o. very pleasant female patient who presents with the following:  Here today as a new patient to establish care I saw her several years ago at [place] she thinks  She felt very tired over the summer- otherwise no acute illness symptoms, she just got really worn out for about 6 weeks.  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  She has 2 daughters, 69 and 37 yo,  5th and 1st grade.  They are both learning online this year due to pandemic   She is from [place] originally   She is taking some collagen orally as a skin supplement She exercises by walking and playing with her kids   She has a GYN provider- had been seeing Arlyce Dice until he retired, will now start with a new doctor  She is not fasting this am   There are no active problems to display for this patient.   Past Medical History:  Diagnosis Date  . Abnormal Pap smear   . Acid reflux   . Constipation   . Hemorrhoids   . Herpes   . IBS (irritable bowel syndrome)     Past Surgical History:  Procedure Laterality Date  . COLPOSCOPY    . HEMORRHOID BANDING    . WISDOM  TOOTH EXTRACTION      Social History   Tobacco Use  . Smoking status: Never Smoker  . Smokeless tobacco: Never Used  Substance Use Topics  . Alcohol use: Yes    Comment: rare  . Drug use: No    Family History  Problem Relation Age of Onset  . Clotting disorder Father   . Alzheimer's disease Maternal Grandmother   . Heart disease Maternal Grandfather   . Diabetes Maternal Grandfather   . Obesity Maternal Grandfather   . Cancer Paternal Grandmother   . Lung cancer Paternal Grandfather     No Known Allergies  Medication list has been reviewed and updated.  No current outpatient medications on file prior to visit.   No current facility-administered medications on file prior to visit.     Review of Systems:  As per HPI- otherwise negative.  No fever or chills, no chest pain or shortness of breath, no rash Physical Examination: Vitals:   09/05/19 1109  BP: 110/64  Pulse: 77  Resp: 16  Temp: 98.6 F (37 C)  SpO2: 98%   Vitals:   09/05/19 1109  Weight: 137 lb (62.1 kg)  Height: 5\' 6"  (1.676  m)   Body mass index is 22.11 kg/m. Ideal Body Weight: Weight in (lb) to have BMI = 25: 154.6  GEN: WDWN, NAD, Non-toxic, A & O x 3, slim build, looks well  HEENT: Atraumatic, Normocephalic. Neck supple. No masses, No LAD.  TM wnl  Ears and Nose: No external deformity. CV: RRR, No M/G/R. No JVD. No thrill. No extra heart sounds. PULM: CTA B, no wheezes, crackles, rhonchi. No retractions. No resp. distress. No accessory muscle use. ABD: S, NT, ND, +BS. No rebound. No HSM. No CVA tenderness and benign belly  EXTR: No c/c/e NEURO Normal gait.  PSYCH: Normally interactive. Conversant. Not depressed or anxious appearing.  Calm demeanor.   Results for orders placed or performed in visit on 09/05/19  Urine Culture   Specimen: Blood  Result Value Ref Range   MICRO NUMBER: [id number]    SPECIMEN QUALITY: Adequate    Sample Source URINE    STATUS: FINAL    ISOLATE 1:       Growth of mixed flora was isolated, suggesting probable contamination. No further testing will be performed. If clinically indicated, recollection using a method to minimize contamination, with prompt transfer to Urine Culture Transport Tube, is  recommended.   CBC  Result Value Ref Range   WBC 8.2 4.0 - 10.5 K/uL   RBC 3.71 (L) 3.87 - 5.11 Mil/uL   Platelets 204.0 150.0 - 400.0 K/uL   Hemoglobin 12.5 12.0 - 15.0 g/dL   HCT 36.7 36.0 - 46.0 %   MCV 99.0 78.0 - 100.0 fl   MCHC 33.9 30.0 - 36.0 g/dL   RDW 12.5 11.5 - 15.5 %  Comprehensive metabolic panel  Result Value Ref Range   Sodium 134 (L) 135 - 145 mEq/L   Potassium 3.8 3.5 - 5.1 mEq/L   Chloride 101 96 - 112 mEq/L   CO2 26 19 - 32 mEq/L   Glucose, Bld 79 70 - 99 mg/dL   BUN 7 6 - 23 mg/dL   Creatinine, Ser 0.63 0.40 - 1.20 mg/dL   Total Bilirubin 0.4 0.2 - 1.2 mg/dL   Alkaline Phosphatase 41 39 - 117 U/L   AST 13 0 - 37 U/L   ALT 6 0 - 35 U/L   Total Protein 6.2 6.0 - 8.3 g/dL   Albumin 4.3 3.5 - 5.2 g/dL   Calcium 8.8 8.4 - 10.5 mg/dL   GFR 104.98 >60.00 mL/min  Hemoglobin A1c  Result Value Ref Range   Hgb A1c MFr Bld 4.8 4.6 - 6.5 %  Lipid panel  Result Value Ref Range   Cholesterol 129 0 - 200 mg/dL   Triglycerides 41.0 0.0 - 149.0 mg/dL   HDL 58.20 >39.00 mg/dL   VLDL 8.2 0.0 - 40.0 mg/dL   LDL Cholesterol 63 0 - 99 mg/dL   Total CHOL/HDL Ratio 2    NonHDL 71.10   Vitamin D (25 hydroxy)  Result Value Ref Range   VITD 32.32 30.00 - 100.00 ng/mL  B12  Result Value Ref Range   Vitamin B-12 308 211 - 911 pg/mL  TSH  Result Value Ref Range   TSH 0.94 0.35 - 4.50 uIU/mL  POCT urinalysis dipstick  Result Value Ref Range   Color, UA yellow yellow   Clarity, UA cloudy (A) clear   Glucose, UA negative negative mg/dL   Bilirubin, UA negative negative   Ketones, POC UA negative negative mg/dL   Spec Grav, UA 1.010 1.010 - 1.025   Blood, UA  negative negative   pH, UA 7.0 5.0 - 8.0   Protein Ur, POC negative  negative mg/dL   Urobilinogen, UA 0.2 0.2 or 1.0 E.U./dL   Nitrite, UA Negative Negative   Leukocytes, UA Trace (A) Negative    Assessment and Plan: Fatigue, unspecified type - Plan: CBC, Comprehensive metabolic panel, Vitamin D (25 hydroxy), B12, TSH  Dysuria - Plan: Urine Culture, POCT urinalysis dipstick, nitrofurantoin, macrocrystal-monohydrate, (MACROBID) 100 MG capsule  Screening for diabetes mellitus - Plan: Hemoglobin A1c  Screening for hyperlipidemia - Plan: Lipid panel  Here today to establish care as a new patient.  Over the summer she felt more fatigued than is normal for about 6 weeks.  The symptoms are now basically resolved, but she is still somewhat concerned.  I explained that Epstein-Barr virus titer is less likely to be helpful, as she probably has had mono at some point in the past.  Will check labs as above and be back in touch with her ASAP Flu shot already done Possible UTI symptoms, urine culture pending.  Start Macrobid today  Signed Abbe Amsterdam, MD  Procedure labs 10/2, message to patient  Results for orders placed or performed in visit on 09/05/19  Urine Culture   Specimen: Blood  Result Value Ref Range   MICRO NUMBER: [id number]    SPECIMEN QUALITY: Adequate    Sample Source URINE    STATUS: FINAL    ISOLATE 1:      Growth of mixed flora was isolated, suggesting probable contamination. No further testing will be performed. If clinically indicated, recollection using a method to minimize contamination, with prompt transfer to Urine Culture Transport Tube, is  recommended.   CBC  Result Value Ref Range   WBC 8.2 4.0 - 10.5 K/uL   RBC 3.71 (L) 3.87 - 5.11 Mil/uL   Platelets 204.0 150.0 - 400.0 K/uL   Hemoglobin 12.5 12.0 - 15.0 g/dL   HCT 54.0 98.1 - 19.1 %   MCV 99.0 78.0 - 100.0 fl   MCHC 33.9 30.0 - 36.0 g/dL   RDW 47.8 29.5 - 62.1 %  Comprehensive metabolic panel  Result Value Ref Range   Sodium 134 (L) 135 - 145 mEq/L   Potassium 3.8  3.5 - 5.1 mEq/L   Chloride 101 96 - 112 mEq/L   CO2 26 19 - 32 mEq/L   Glucose, Bld 79 70 - 99 mg/dL   BUN 7 6 - 23 mg/dL   Creatinine, Ser 3.08 0.40 - 1.20 mg/dL   Total Bilirubin 0.4 0.2 - 1.2 mg/dL   Alkaline Phosphatase 41 39 - 117 U/L   AST 13 0 - 37 U/L   ALT 6 0 - 35 U/L   Total Protein 6.2 6.0 - 8.3 g/dL   Albumin 4.3 3.5 - 5.2 g/dL   Calcium 8.8 8.4 - 65.7 mg/dL   GFR 846.96 >29.52 mL/min  Hemoglobin A1c  Result Value Ref Range   Hgb A1c MFr Bld 4.8 4.6 - 6.5 %  Lipid panel  Result Value Ref Range   Cholesterol 129 0 - 200 mg/dL   Triglycerides 84.1 0.0 - 149.0 mg/dL   HDL 32.44 >01.02 mg/dL   VLDL 8.2 0.0 - 72.5 mg/dL   LDL Cholesterol 63 0 - 99 mg/dL   Total CHOL/HDL Ratio 2    NonHDL 71.10   Vitamin D (25 hydroxy)  Result Value Ref Range   VITD 32.32 30.00 - 100.00 ng/mL  B12  Result Value Ref Range  Vitamin B-12 308 211 - 911 pg/mL  TSH  Result Value Ref Range   TSH 0.94 0.35 - 4.50 uIU/mL  POCT urinalysis dipstick  Result Value Ref Range   Color, UA yellow yellow   Clarity, UA cloudy (A) clear   Glucose, UA negative negative mg/dL   Bilirubin, UA negative negative   Ketones, POC UA negative negative mg/dL   Spec Grav, UA [id number] [id number] - 1.025   Blood, UA negative negative   pH, UA 7.0 5.0 - 8.0   Protein Ur, POC negative negative mg/dL   Urobilinogen, UA 0.2 0.2 or 1.0 E.U./dL   Nitrite, UA Negative Negative   Leukocytes, UA Trace (A) Negative    Blood counts look fine Metabolic profile normal Your A1c is normal, no sign of diabetes Cholesterol looks great Vitamin D and vitamin B12 in normal range Thyroid normal  I am just waiting on your urine culture.  Otherwise your labs look very normal.  So far no explanation for the fatigue you have experienced.  Please let me know if this continues, or if you would otherwise like to look further.  Take care   Received her urine culture 10/3, message to patient

## 2019-09-05 ENCOUNTER — Other Ambulatory Visit: Payer: Self-pay

## 2019-09-05 ENCOUNTER — Ambulatory Visit: Payer: BC Managed Care – PPO | Admitting: Family Medicine

## 2019-09-05 ENCOUNTER — Encounter: Payer: Self-pay | Admitting: Family Medicine

## 2019-09-05 VITALS — BP 110/64 | HR 77 | Temp 98.6°F | Resp 16 | Ht 66.0 in | Wt 137.0 lb

## 2019-09-05 DIAGNOSIS — Z13220 Encounter for screening for lipoid disorders: Secondary | ICD-10-CM | POA: Diagnosis not present

## 2019-09-05 DIAGNOSIS — Z131 Encounter for screening for diabetes mellitus: Secondary | ICD-10-CM

## 2019-09-05 DIAGNOSIS — R300 Dysuria: Secondary | ICD-10-CM

## 2019-09-05 DIAGNOSIS — R5383 Other fatigue: Secondary | ICD-10-CM | POA: Diagnosis not present

## 2019-09-05 LAB — POCT URINALYSIS DIP (MANUAL ENTRY)
Bilirubin, UA: NEGATIVE
Blood, UA: NEGATIVE
Glucose, UA: NEGATIVE mg/dL
Ketones, POC UA: NEGATIVE mg/dL
Nitrite, UA: NEGATIVE
Protein Ur, POC: NEGATIVE mg/dL
Spec Grav, UA: 1.01 (ref 1.010–1.025)
Urobilinogen, UA: 0.2 E.U./dL
pH, UA: 7 (ref 5.0–8.0)

## 2019-09-05 LAB — COMPREHENSIVE METABOLIC PANEL
ALT: 6 U/L (ref 0–35)
AST: 13 U/L (ref 0–37)
Albumin: 4.3 g/dL (ref 3.5–5.2)
Alkaline Phosphatase: 41 U/L (ref 39–117)
BUN: 7 mg/dL (ref 6–23)
CO2: 26 mEq/L (ref 19–32)
Calcium: 8.8 mg/dL (ref 8.4–10.5)
Chloride: 101 mEq/L (ref 96–112)
Creatinine, Ser: 0.63 mg/dL (ref 0.40–1.20)
GFR: 104.98 mL/min (ref >60.00)
Glucose, Bld: 79 mg/dL (ref 70–99)
Potassium: 3.8 mEq/L (ref 3.5–5.1)
Sodium: 134 mEq/L — ABNORMAL LOW (ref 135–145)
Total Bilirubin: 0.4 mg/dL (ref 0.2–1.2)
Total Protein: 6.2 g/dL (ref 6.0–8.3)

## 2019-09-05 LAB — VITAMIN D 25 HYDROXY (VIT D DEFICIENCY, FRACTURES): VITD: 32.32 ng/mL (ref 30.00–100.00)

## 2019-09-05 LAB — LIPID PANEL
Cholesterol: 129 mg/dL (ref 0–200)
HDL: 58.2 mg/dL (ref >39.00)
LDL Cholesterol: 63 mg/dL (ref 0–99)
NonHDL: 71.1
Total CHOL/HDL Ratio: 2
Triglycerides: 41 mg/dL (ref 0.0–149.0)
VLDL: 8.2 mg/dL (ref 0.0–40.0)

## 2019-09-05 LAB — CBC
HCT: 36.7 % (ref 36.0–46.0)
Hemoglobin: 12.5 g/dL (ref 12.0–15.0)
MCHC: 33.9 g/dL (ref 30.0–36.0)
MCV: 99 fl (ref 78.0–100.0)
Platelets: 204 10*3/uL (ref 150.0–400.0)
RBC: 3.71 Mil/uL — ABNORMAL LOW (ref 3.87–5.11)
RDW: 12.5 % (ref 11.5–15.5)
WBC: 8.2 10*3/uL (ref 4.0–10.5)

## 2019-09-05 LAB — VITAMIN B12: Vitamin B-12: 308 pg/mL (ref 211–911)

## 2019-09-05 LAB — HEMOGLOBIN A1C: Hgb A1c MFr Bld: 4.8 % (ref 4.6–6.5)

## 2019-09-05 LAB — TSH: TSH: 0.94 u[IU]/mL (ref 0.35–4.50)

## 2019-09-05 MED ORDER — NITROFURANTOIN MONOHYD MACRO 100 MG PO CAPS: 100.0000 mg | ORAL_CAPSULE | Freq: Two times a day (BID) | ORAL | 0 refills | Status: DC

## 2019-09-05 NOTE — Patient Instructions (Addendum)
It was great to meet you today! I will be in touch with your labs asap  I will look for any cause of the fatigue you experienced over the summer on your labs today    We will treat you for a possible UTI with macrobid for one week- let me know if your symptoms are worsening at all. We will get a urine culture as well

## 2019-09-06 ENCOUNTER — Encounter: Payer: Self-pay | Admitting: Family Medicine

## 2019-09-06 LAB — URINE CULTURE
MICRO NUMBER:: 944028
SPECIMEN QUALITY:: ADEQUATE

## 2020-08-18 ENCOUNTER — Encounter: Payer: Self-pay | Admitting: Obstetrics and Gynecology

## 2020-09-01 ENCOUNTER — Ambulatory Visit: Payer: BC Managed Care – PPO | Admitting: Obstetrics and Gynecology

## 2020-12-28 ENCOUNTER — Other Ambulatory Visit (HOSPITAL_COMMUNITY)
Admission: RE | Admit: 2020-12-28 | Discharge: 2020-12-28 | Disposition: A | Discharge status: Home / Self Care | Payer: BC Managed Care – PPO | Source: Ambulatory Visit | Attending: Obstetrics and Gynecology | Admitting: Obstetrics and Gynecology

## 2020-12-28 ENCOUNTER — Ambulatory Visit (INDEPENDENT_AMBULATORY_CARE_PROVIDER_SITE_OTHER): Payer: BC Managed Care – PPO | Admitting: Obstetrics and Gynecology

## 2020-12-28 ENCOUNTER — Encounter: Payer: Self-pay | Admitting: Obstetrics and Gynecology

## 2020-12-28 ENCOUNTER — Other Ambulatory Visit: Payer: Self-pay

## 2020-12-28 VITALS — BP 110/60 | HR 72 | Ht 65.5 in | Wt 130.0 lb

## 2020-12-28 DIAGNOSIS — Z124 Encounter for screening for malignant neoplasm of cervix: Secondary | ICD-10-CM | POA: Insufficient documentation

## 2020-12-28 DIAGNOSIS — Z01419 Encounter for gynecological examination (general) (routine) without abnormal findings: Secondary | ICD-10-CM | POA: Diagnosis not present

## 2020-12-28 DIAGNOSIS — Z30431 Encounter for routine checking of intrauterine contraceptive device: Secondary | ICD-10-CM

## 2020-12-28 NOTE — Patient Instructions (Signed)

## 2020-12-28 NOTE — Progress Notes (Signed)
40 y.o. G44P1102 Married White or Caucasian Not Hispanic or Latino female here as a new patient for an annual exam.  She has a mirena IUD, inserted in 4/19. No cycles. No dyspareunia.   H/O IBS in her 20's, doing fine with dietary change.  No urinary C/O.   H/O genital herpes, but HSV I by type. Hasn't had an outbreak in 8 years. She does get rare oral HSV.     No LMP recorded (lmp unknown). (Menstrual status: IUD).          Sexually active: Yes.    The current method of family planning is Mirena IUD inserted in April, 2019.    Exercising: No.  The patient does not participate in regular exercise at present. Smoker:  no  Health Maintenance: Pap:  2019 per patient with [facility] History of abnormal Pap:  Yes, patient has had colposcopy at 20, no surgery on her cervix.  MMG:  never BMD:   n/a Colonoscopy: n/a TDaP:  2014 Gardasil: never, discussed option.    reports that she has never smoked. She has never used smokeless tobacco. She reports current alcohol use. She reports that she does not use drugs. Just occasional ETOH. 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   Past Medical History:  Diagnosis Date  . Abnormal Pap smear   . Acid reflux   . Constipation   . Hemorrhoids   . Herpes   . IBS (irritable bowel syndrome)     Past Surgical History:  Procedure Laterality Date  . COLPOSCOPY    . HEMORRHOID BANDING    . WISDOM TOOTH EXTRACTION      Current Outpatient Medications  Medication Sig Dispense Refill  . levonorgestrel (MIRENA, 52 MG,) 20 MCG/24HR IUD Mirena 20 mcg/24 hours (7 yrs) 52 mg intrauterine device  Take 1 device by intrauterine route.    . Multiple Vitamin (MULTIVITAMIN) tablet Take 1 tablet by mouth daily.    [place] Kitchen tretinoin (RETIN-A) 0.1 % cream APPLY EXTERNALLY TO FACE EVERY NIGHT AT BEDTIME     No current facility-administered medications for this visit.    Family History  Problem Relation Age of Onset  . Clotting disorder  Father   . Arthritis Father   . Alzheimer's disease Maternal Grandmother   . Heart disease Maternal Grandfather   . Diabetes Maternal Grandfather   . Obesity Maternal Grandfather   . Cancer Paternal Grandmother   . Lung cancer Paternal Grandfather   PGM died in her 14's of GYN cancer.  Father diagnosed with clotting disorder a few years ago, developed DVT's.   Review of Systems  Constitutional: Negative.   HENT: Negative.   Eyes: Negative.   Respiratory: Negative.   Cardiovascular: Negative.   Gastrointestinal: Negative.   Endocrine: Negative.   Genitourinary: Negative.   Musculoskeletal: Negative.   Skin: Negative.   Allergic/Immunologic: Negative.   Neurological: Negative.   Hematological: Negative.   Psychiatric/Behavioral: Negative.     Exam:   BP 110/60 (BP Location: Left Arm, Patient Position: Sitting, Cuff Size: Normal)   Pulse 72   Ht 5' 5.5" (1.664 m)   Wt 130 lb (59 kg)   LMP  (LMP Unknown)   BMI 21.30 kg/m   Weight change: @WEIGHTCHANGE @ Height:   Height: 5' 5.5" (166.4 cm)  Ht Readings from Last 3 Encounters:  12/28/20 5' 5.5" (1.664 m)  09/05/19 5\' 6"  (1.676 m)  12/01/17 5\' 6"  (1.676 m)    General  appearance: alert, cooperative and appears stated age Head: Normocephalic, without obvious abnormality, atraumatic Neck: no adenopathy, supple, symmetrical, trachea midline and thyroid normal to inspection and palpation Lungs: clear to auscultation bilaterally Cardiovascular: regular rate and rhythm Breasts: normal appearance, no masses or tenderness Abdomen: soft, non-tender; non distended,  no masses,  no organomegaly Extremities: extremities normal, atraumatic, no cyanosis or edema Skin: Skin color, texture, turgor normal. No rashes or lesions Lymph nodes: Cervical, supraclavicular, and axillary nodes normal. No abnormal inguinal nodes palpated Neurologic: Grossly normal   Pelvic: External genitalia:  no lesions              Urethra:  normal appearing  urethra with no masses, tenderness or lesions              Bartholins and Skenes: normal                 Vagina: normal appearing vagina with normal color and discharge, no lesions              Cervix: no lesions and IUD string 1 cm               Bimanual Exam:  Uterus:  normal size, contour, position, consistency, mobility, non-tender and anteverted              Adnexa: no mass, fullness, tenderness               Rectovaginal: Confirms               Anus:  normal sphincter tone, no lesions    1. Well woman exam Discussed breast self exam Discussed calcium and vit D intake No labs this year Mammogram due, # given    2. Screening for cervical cancer  - Cytology - PAP  3. IUD check up Doing well

## 2020-12-30 LAB — CYTOLOGY - PAP
Comment: NEGATIVE
Diagnosis: NEGATIVE
High risk HPV: NEGATIVE

## 2021-03-09 ENCOUNTER — Other Ambulatory Visit: Payer: Self-pay | Admitting: Obstetrics and Gynecology

## 2021-03-09 DIAGNOSIS — Z1231 Encounter for screening mammogram for malignant neoplasm of breast: Secondary | ICD-10-CM

## 2021-06-01 ENCOUNTER — Ambulatory Visit
Admission: RE | Admit: 2021-06-01 | Discharge: 2021-06-01 | Disposition: A | Discharge status: Home / Self Care | Payer: BC Managed Care – PPO | Source: Ambulatory Visit

## 2021-06-01 ENCOUNTER — Other Ambulatory Visit: Payer: Self-pay

## 2021-06-01 DIAGNOSIS — Z1231 Encounter for screening mammogram for malignant neoplasm of breast: Secondary | ICD-10-CM

## 2021-06-03 ENCOUNTER — Ambulatory Visit (INDEPENDENT_AMBULATORY_CARE_PROVIDER_SITE_OTHER): Payer: BC Managed Care – PPO | Admitting: Gynecology

## 2021-06-03 ENCOUNTER — Other Ambulatory Visit: Payer: Self-pay

## 2021-06-03 DIAGNOSIS — Z23 Encounter for immunization: Secondary | ICD-10-CM | POA: Diagnosis not present

## 2021-07-14 ENCOUNTER — Other Ambulatory Visit: Payer: Self-pay

## 2021-07-14 ENCOUNTER — Ambulatory Visit (INDEPENDENT_AMBULATORY_CARE_PROVIDER_SITE_OTHER): Payer: BC Managed Care – PPO

## 2021-07-14 DIAGNOSIS — Z23 Encounter for immunization: Secondary | ICD-10-CM | POA: Diagnosis not present

## 2021-07-14 NOTE — Progress Notes (Signed)
Verified by Copland for patient to have 2nd HPV shot here.  1st HPV done in June at GYN. Pt will need 3 rd HPV shot.    Pt received shot in left deltoid. Pt tolerated well. Pt states she will call to schedule 3rd HPV shot.

## 2021-07-15 ENCOUNTER — Ambulatory Visit: Payer: BC Managed Care – PPO

## 2021-10-21 ENCOUNTER — Encounter: Payer: BC Managed Care – PPO | Admitting: Family Medicine

## 2021-11-15 ENCOUNTER — Other Ambulatory Visit: Payer: Self-pay

## 2021-11-15 ENCOUNTER — Telehealth: Payer: Self-pay | Admitting: Family Medicine

## 2021-11-15 ENCOUNTER — Telehealth: Payer: BC Managed Care – PPO | Admitting: Family Medicine

## 2021-11-15 DIAGNOSIS — J029 Acute pharyngitis, unspecified: Secondary | ICD-10-CM

## 2021-11-15 DIAGNOSIS — J020 Streptococcal pharyngitis: Secondary | ICD-10-CM | POA: Diagnosis not present

## 2021-11-15 LAB — POCT INFLUENZA A/B
Influenza A, POC: NEGATIVE
Influenza B, POC: NEGATIVE

## 2021-11-15 LAB — POCT RAPID STREP A (OFFICE): Rapid Strep A Screen: POSITIVE — AB

## 2021-11-15 LAB — POC COVID19 BINAXNOW: SARS Coronavirus 2 Ag: NEGATIVE

## 2021-11-15 MED ORDER — AMOXICILLIN 500 MG PO TABS: 500.0000 mg | ORAL_TABLET | Freq: Two times a day (BID) | ORAL | 0 refills | Status: AC

## 2021-11-15 NOTE — Progress Notes (Signed)
Virtual Visit via Video Note  I connected with Bianca Webster on 11/15/21 at  2:30 PM EST by a video enabled telemedicine application 2/2 COVID-19 pandemic and verified that I am speaking with the correct person using two identifiers.  Location patient: home Location provider:work or home office Persons participating in the virtual visit: patient, provider  I discussed the limitations of evaluation and management by telemedicine and the availability of in person appointments. The patient expressed understanding and agreed to proceed.   HPI: Pt is a 40 yo female with pmh sig for IBS who is followed by Dr. Dallas Schimke and seen for acute concern.  Pt with sore throat that started Friday night.  On Sunday throat became worse.  Pt denies cough, fever, chills, ear pain/pressure, facial pain/pressure, HA.  Pt's 44 yo daughter had strep throat last wk.    ROS: See pertinent positives and negatives per HPI.  Past Medical History:  Diagnosis Date   Abnormal Pap smear    Acid reflux    Constipation    Hemorrhoids    Herpes    IBS (irritable bowel syndrome)     Past Surgical History:  Procedure Laterality Date   COLPOSCOPY     HEMORRHOID BANDING     WISDOM TOOTH EXTRACTION      Family History  Problem Relation Age of Onset   Clotting disorder Father    Arthritis Father    Alzheimer's disease Maternal Grandmother    Heart disease Maternal Grandfather    Diabetes Maternal Grandfather    Obesity Maternal Grandfather    Cancer Paternal Grandmother    Lung cancer Paternal Grandfather    Breast cancer Neg Hx     Current Outpatient Medications:    levonorgestrel (MIRENA, 52 MG,) 20 MCG/24HR IUD, Mirena 20 mcg/24 hours (7 yrs) 52 mg intrauterine device  Take 1 device by intrauterine route., Disp: , Rfl:    Multiple Vitamin (MULTIVITAMIN) tablet, Take 1 tablet by mouth daily., Disp: , Rfl:    tretinoin (RETIN-A) 0.1 % cream, APPLY EXTERNALLY TO FACE EVERY NIGHT AT BEDTIME, Disp: , Rfl:    EXAM:  VITALS per patient if applicable:  RR between 12-20 bpm  GENERAL: alert, oriented, appears well and in no acute distress  HEENT: atraumatic, conjunctiva clear, no obvious abnormalities on inspection of external nose and ears  NECK: normal movements of the head and neck  LUNGS: on inspection no signs of respiratory distress, breathing rate appears normal, no obvious gross SOB, gasping or wheezing  CV: no obvious cyanosis  MS: moves all visible extremities without noticeable abnormality  PSYCH/NEURO: pleasant and cooperative, no obvious depression or anxiety, speech and thought processing grossly intact  ASSESSMENT AND PLAN:  Discussed the following assessment and plan:  Strep throat  -POC strep test positive today. -start abx -supportive care including gargling with warm salt water or chloraseptic spray. -change toothbrush - Plan: amoxicillin (AMOXIL) 500 MG tablet  Sore throat  -POC strep test positive - Plan: POC Influenza A/B, POC COVID-19, POC Rapid Strep A  F/u prn   I discussed the assessment and treatment plan with the patient. The patient was provided an opportunity to ask questions and all were answered. The patient agreed with the plan and demonstrated an understanding of the instructions.   The patient was advised to call back or seek an in-person evaluation if the symptoms worsen or if the condition fails to improve as anticipated.  Bianca Saint, MD

## 2021-11-15 NOTE — Telephone Encounter (Signed)
Patient calling in with respiratory symptoms: Shortness of breath, chest pain, palpitations or other red words send to Triage  Does the patient have a fever over 100, cough, congestion, sore throat, runny nose, lost of taste/smell (please list symptoms that patient has)? Strep throat symptoms that started yesterday  What date did symptoms start?11/14/21 (If over 5 days ago, pt may be scheduled for in person visit)  Have you tested for Covid in the last 5 days? No   If yes, was it positive OR negative ? If positive in the last 5 days, please schedule virtual visit now. If negative, schedule for an in person OV with the next available provider if PCP has no openings. Please also let patient know they will be tested again (follow the script below)  "you will have to arrive prior to your appt time to be Covid tested. Please park in back of office at the cone & call [phone] to let the staff know you have arrived. A staff member will meet you at your car to do a rapid covid test. Once the test has resulted you will be notified by phone of your results to determine if appt will remain an in person visit or be converted to a virtual/phone visit. If you arrive less than before your appt time, your visit will be automatically converted to virtual & any recommended testing will happen AFTER the visit."   THINGS TO REMEMBER  If no availability for virtual visit in office,  please schedule another [place] office  If no availability at another [place] office, please instruct patient that they can schedule an evisit or virtual visit through their mychart account. Visits up to 8pm  patients can be seen in office 5 days after positive COVID test

## 2021-11-22 ENCOUNTER — Telehealth: Payer: Self-pay | Admitting: Family Medicine

## 2021-11-22 NOTE — Telephone Encounter (Signed)
Patient called stating that she had a visit with Dr. Salomon Fick last week and was prescribed amoxicillin. Patient says that she forgot to tell the nurse this, but she usually gets a yeast infection when she takes amoxicillin and she has one now.  Patient would like to know what to do for this and if something could be called in.  Please advise.

## 2021-11-24 ENCOUNTER — Encounter: Payer: Self-pay | Admitting: Family Medicine

## 2021-11-24 MED ORDER — FLUCONAZOLE 150 MG PO TABS: 150.0000 mg | ORAL_TABLET | Freq: Once | ORAL | 0 refills | Status: AC

## 2021-11-24 NOTE — Telephone Encounter (Signed)
Patient would like to know if Dr. Patsy Lager would be able to send something for a yeast infection due to the Amoxicillin, since the other provider might now be able to prescribe it to her. Please advice.   [facility] [address] - [place], [place] - [address]  [address], [address], [place] [address]  Phone:  [phone]  Fax:  [phone]

## 2021-11-24 NOTE — Telephone Encounter (Signed)
Are you okay with sending in a RX for yeast?

## 2022-01-04 NOTE — Patient Instructions (Addendum)
It was good to see you again today, I will be in touch with your labs asap We will screen you also for factor 5 leiden- if you do have this we may take a few precautions when you travel,etc Recommend covid series if not done yet  Take care!

## 2022-01-04 NOTE — Progress Notes (Addendum)
[facility] [address], [address] [place], [place] [address] [phone]  Date:  01/05/2022   Name:  Bianca Webster   DOB:  1981/03/21   MRN:  [id number]  PCP:  Pearline Cables, MD    Chief Complaint: Annual Exam (Concerns/ questions: none/Flu shot: 10/03/2021/Hep C Screen due)   History of Present Illness:  Bianca Webster is a 41 y.o. very pleasant female patient who presents with the following:  Generally healthy young woman here today for physical exam History of acid reflux, IBS- this is much better now She uses some kombucha and flax seed which has helped  Most recent visit with myself October 2020  Married to [place], she has 2 school-aged daughters- ages 24 and 24  Pap is up-to-date per GYN Mammogram also up-to-date Labs done in 2020, can update today Flu vaccine- done  COVID series- recommended   Her father was dx with factor 5 leiden and has had some blood clots  Patient herself has no history of blood clot or miscarriage.  She does not smoke, she has a Mirena IUD She would be interested in screening for factor V Leiden  Rarely uses alcohol She would like to get more exercise There are no problems to display for this patient.   Past Medical History:  Diagnosis Date   Abnormal Pap smear    Acid reflux    Constipation    Hemorrhoids    Herpes    IBS (irritable bowel syndrome)     Past Surgical History:  Procedure Laterality Date   COLPOSCOPY     HEMORRHOID BANDING     WISDOM TOOTH EXTRACTION      Social History   Tobacco Use   Smoking status: Never   Smokeless tobacco: Never  Substance Use Topics   Alcohol use: Yes    Comment: rare   Drug use: No    Family History  Problem Relation Age of Onset   Clotting disorder Father    Arthritis Father    Alzheimer's disease Maternal Grandmother    Heart disease Maternal Grandfather    Diabetes Maternal Grandfather    Obesity Maternal  Grandfather    Cancer Paternal Grandmother    Lung cancer Paternal Grandfather    Breast cancer Neg Hx     No Known Allergies  Medication list has been reviewed and updated.  Current Outpatient Medications on File Prior to Visit  Medication Sig Dispense Refill   levonorgestrel (MIRENA, 52 MG,) 20 MCG/24HR IUD Mirena 20 mcg/24 hours (7 yrs) 52 mg intrauterine device  Take 1 device by intrauterine route.     Multiple Vitamin (MULTIVITAMIN) tablet Take 1 tablet by mouth daily.     tretinoin (RETIN-A) 0.1 % cream APPLY EXTERNALLY TO FACE EVERY NIGHT AT BEDTIME     No current facility-administered medications on file prior to visit.    Review of Systems:  As per HPI- otherwise negative.   Physical Examination: Vitals:   01/05/22 1444  BP: 100/60  Pulse: 71  Temp: 98.2 F (36.8 C)  SpO2: 100%   Vitals:   01/05/22 1444  Weight: 133 lb 3.2 oz (60.4 kg)  Height: 5\' 6"  (1.676 m)   Body mass index is 21.5 kg/m. Ideal Body Weight: Weight in (lb) to have BMI = 25: 154.6  GEN: no acute distress.  Slender build, looks well HEENT: Atraumatic, Normocephalic.  Bilateral TM wnl, oropharynx normal.  PEERL,EOMI.  Ears and Nose: No external deformity. CV: RRR, No M/G/R. No JVD. No thrill. No extra heart sounds. PULM: CTA B, no wheezes, crackles, rhonchi. No retractions. No resp. distress. No accessory muscle use. ABD: S, NT, ND, +BS. No rebound. No HSM. EXTR: No c/c/e PSYCH: Normally interactive. Conversant.    Assessment and Plan: Physical exam  Screening for deficiency anemia - Plan: CBC  Screening for diabetes mellitus - Plan: Comprehensive metabolic panel, Hemoglobin A1c  Screening for hyperlipidemia - Plan: Lipid panel  Fatigue, unspecified type - Plan: TSH, VITAMIN D 25 Hydroxy (Vit-D Deficiency, Fractures)  Encounter for hepatitis C screening test for low risk patient - Plan: Hepatitis C antibody  Family history of bleeding or clotting disorder - Plan: Factor 5  leiden  Physical exam today Encouraged healthy diet and exercise routine Will plan further follow- up pending labs. Screening for factor V Leiden  Signed Lamar Blinks, MD  Received her labs 2/2, message to patient  Results for orders placed or performed in visit on 01/05/22  CBC  Result Value Ref Range   WBC 5.9 4.0 - 10.5 K/uL   RBC 3.66 (L) 3.87 - 5.11 Mil/uL   Platelets 251.0 150.0 - 400.0 K/uL   Hemoglobin 12.3 12.0 - 15.0 g/dL   HCT 35.9 (L) 36.0 - 46.0 %   MCV 98.1 78.0 - 100.0 fl   MCHC 34.2 30.0 - 36.0 g/dL   RDW 12.4 11.5 - 15.5 %  Comprehensive metabolic panel  Result Value Ref Range   Sodium 139 135 - 145 mEq/L   Potassium 3.9 3.5 - 5.1 mEq/L   Chloride 104 96 - 112 mEq/L   CO2 29 19 - 32 mEq/L   Glucose, Bld 69 (L) 70 - 99 mg/dL   BUN 11 6 - 23 mg/dL   Creatinine, Ser 0.86 0.40 - 1.20 mg/dL   Total Bilirubin 0.5 0.2 - 1.2 mg/dL   Alkaline Phosphatase 45 39 - 117 U/L   AST 16 0 - 37 U/L   ALT 6 0 - 35 U/L   Total Protein 6.8 6.0 - 8.3 g/dL   Albumin 4.4 3.5 - 5.2 g/dL   GFR 83.73 >60.00 mL/min   Calcium 9.1 8.4 - 10.5 mg/dL  Hemoglobin A1c  Result Value Ref Range   Hgb A1c MFr Bld 4.9 4.6 - 6.5 %  Lipid panel  Result Value Ref Range   Cholesterol 164 0 - 200 mg/dL   Triglycerides 58.0 0.0 - 149.0 mg/dL   HDL 67.70 >39.00 mg/dL   VLDL 11.6 0.0 - 40.0 mg/dL   LDL Cholesterol 85 0 - 99 mg/dL   Total CHOL/HDL Ratio 2    NonHDL 96.59   TSH  Result Value Ref Range   TSH 1.28 0.35 - 5.50 uIU/mL  VITAMIN D 25 Hydroxy (Vit-D Deficiency, Fractures)  Result Value Ref Range   VITD 38.42 30.00 - 100.00 ng/mL  Hepatitis C antibody  Result Value Ref Range   Hepatitis C Ab NON-REACTIVE NON-REACTIVE   SIGNAL TO CUT-OFF <0.02 <1.00

## 2022-01-05 ENCOUNTER — Encounter: Payer: BC Managed Care – PPO | Admitting: Family Medicine

## 2022-01-05 ENCOUNTER — Ambulatory Visit (INDEPENDENT_AMBULATORY_CARE_PROVIDER_SITE_OTHER): Payer: BC Managed Care – PPO | Admitting: Family Medicine

## 2022-01-05 VITALS — BP 100/60 | HR 71 | Temp 98.2°F | Ht 66.0 in | Wt 133.2 lb

## 2022-01-05 DIAGNOSIS — Z1159 Encounter for screening for other viral diseases: Secondary | ICD-10-CM

## 2022-01-05 DIAGNOSIS — Z13220 Encounter for screening for lipoid disorders: Secondary | ICD-10-CM

## 2022-01-05 DIAGNOSIS — Z0000 Encounter for general adult medical examination without abnormal findings: Secondary | ICD-10-CM

## 2022-01-05 DIAGNOSIS — Z131 Encounter for screening for diabetes mellitus: Secondary | ICD-10-CM | POA: Diagnosis not present

## 2022-01-05 DIAGNOSIS — Z130 Encounter for screening for diseases of the blood and blood-forming organs and certain disorders involving the immune mechanism: Secondary | ICD-10-CM

## 2022-01-05 DIAGNOSIS — R5383 Other fatigue: Secondary | ICD-10-CM | POA: Diagnosis not present

## 2022-01-05 DIAGNOSIS — Z832 Family history of diseases of the blood and blood-forming organs and certain disorders involving the immune mechanism: Secondary | ICD-10-CM

## 2022-01-06 ENCOUNTER — Encounter: Payer: Self-pay | Admitting: Family Medicine

## 2022-01-06 LAB — COMPREHENSIVE METABOLIC PANEL
ALT: 6 U/L (ref 0–35)
AST: 16 U/L (ref 0–37)
Albumin: 4.4 g/dL (ref 3.5–5.2)
Alkaline Phosphatase: 45 U/L (ref 39–117)
BUN: 11 mg/dL (ref 6–23)
CO2: 29 mEq/L (ref 19–32)
Calcium: 9.1 mg/dL (ref 8.4–10.5)
Chloride: 104 mEq/L (ref 96–112)
Creatinine, Ser: 0.86 mg/dL (ref 0.40–1.20)
GFR: 83.73 mL/min (ref >60.00)
Glucose, Bld: 69 mg/dL — ABNORMAL LOW (ref 70–99)
Potassium: 3.9 mEq/L (ref 3.5–5.1)
Sodium: 139 mEq/L (ref 135–145)
Total Bilirubin: 0.5 mg/dL (ref 0.2–1.2)
Total Protein: 6.8 g/dL (ref 6.0–8.3)

## 2022-01-06 LAB — CBC
HCT: 35.9 % — ABNORMAL LOW (ref 36.0–46.0)
Hemoglobin: 12.3 g/dL (ref 12.0–15.0)
MCHC: 34.2 g/dL (ref 30.0–36.0)
MCV: 98.1 fl (ref 78.0–100.0)
Platelets: 251 10*3/uL (ref 150.0–400.0)
RBC: 3.66 Mil/uL — ABNORMAL LOW (ref 3.87–5.11)
RDW: 12.4 % (ref 11.5–15.5)
WBC: 5.9 10*3/uL (ref 4.0–10.5)

## 2022-01-06 LAB — VITAMIN D 25 HYDROXY (VIT D DEFICIENCY, FRACTURES): VITD: 38.42 ng/mL (ref 30.00–100.00)

## 2022-01-06 LAB — HEMOGLOBIN A1C: Hgb A1c MFr Bld: 4.9 % (ref 4.6–6.5)

## 2022-01-06 LAB — HEPATITIS C ANTIBODY
Hepatitis C Ab: NONREACTIVE
SIGNAL TO CUT-OFF: <0.02 (ref <1.00)

## 2022-01-06 LAB — LIPID PANEL
Cholesterol: 164 mg/dL (ref 0–200)
HDL: 67.7 mg/dL (ref >39.00)
LDL Cholesterol: 85 mg/dL (ref 0–99)
NonHDL: 96.59
Total CHOL/HDL Ratio: 2
Triglycerides: 58 mg/dL (ref 0.0–149.0)
VLDL: 11.6 mg/dL (ref 0.0–40.0)

## 2022-01-06 LAB — TSH: TSH: 1.28 u[IU]/mL (ref 0.35–5.50)

## 2022-01-10 ENCOUNTER — Telehealth: Payer: Self-pay | Admitting: *Deleted

## 2022-01-10 NOTE — Telephone Encounter (Signed)
Spoke with Bianca Webster at [facility]. She states Factor V Leiden is a send out test to [place]. Their facility received specimen on the 3rd and should result 4 days after test is run. Advised to call back if it has not resulted by Wednesday.

## 2022-01-11 LAB — FACTOR 5 LEIDEN: Result: NEGATIVE

## 2022-01-12 ENCOUNTER — Encounter: Payer: Self-pay | Admitting: Family Medicine

## 2022-05-10 ENCOUNTER — Other Ambulatory Visit: Payer: Self-pay | Admitting: Family Medicine

## 2022-05-10 DIAGNOSIS — Z1231 Encounter for screening mammogram for malignant neoplasm of breast: Secondary | ICD-10-CM

## 2022-05-17 ENCOUNTER — Other Ambulatory Visit: Payer: Self-pay

## 2022-05-17 DIAGNOSIS — Z23 Encounter for immunization: Secondary | ICD-10-CM

## 2022-05-20 ENCOUNTER — Ambulatory Visit (INDEPENDENT_AMBULATORY_CARE_PROVIDER_SITE_OTHER): Payer: BC Managed Care – PPO

## 2022-05-20 DIAGNOSIS — Z23 Encounter for immunization: Secondary | ICD-10-CM | POA: Diagnosis not present

## 2022-05-20 NOTE — Progress Notes (Signed)
Verified by [facility] for patient  2nd HPV shot  1st HPV done in June at GYN. Pt here for #3 rd HPV shot.    Pt received shot in left deltoid. Pt tolerated well.

## 2022-05-27 ENCOUNTER — Ambulatory Visit: Payer: BC Managed Care – PPO

## 2022-05-31 ENCOUNTER — Other Ambulatory Visit: Payer: Self-pay | Admitting: Family Medicine

## 2022-05-31 DIAGNOSIS — Z1231 Encounter for screening mammogram for malignant neoplasm of breast: Secondary | ICD-10-CM

## 2022-06-02 ENCOUNTER — Ambulatory Visit
Admission: RE | Admit: 2022-06-02 | Discharge: 2022-06-02 | Disposition: A | Discharge status: Home / Self Care | Payer: BC Managed Care – PPO | Source: Ambulatory Visit

## 2022-06-02 DIAGNOSIS — Z1231 Encounter for screening mammogram for malignant neoplasm of breast: Secondary | ICD-10-CM

## 2022-06-03 DIAGNOSIS — Z1231 Encounter for screening mammogram for malignant neoplasm of breast: Secondary | ICD-10-CM

## 2022-06-12 LAB — HM DEXA SCAN: HM Dexa Scan: NORMAL

## 2022-06-29 ENCOUNTER — Ambulatory Visit: Payer: BC Managed Care – PPO

## 2022-07-16 LAB — HEMOGLOBIN A1C: Hemoglobin A1C: 5

## 2022-07-16 LAB — COMPREHENSIVE METABOLIC PANEL WITH GFR: eGFR: 81

## 2022-07-16 LAB — LIPID PANEL
Cholesterol: 174 (ref 0–200)
HDL: 72 — AB (ref 35–70)
LDL Cholesterol: 92
Triglycerides: 51 (ref 40–160)

## 2022-10-23 IMAGING — MG MM DIGITAL SCREENING BILAT W/ TOMO AND CAD
6 of 10 series · 6 of 30 positions shown · non-contrast
Comparison: None.

CLINICAL DATA: Screening.

EXAM:
DIGITAL SCREENING BILATERAL MAMMOGRAM WITH TOMOSYNTHESIS AND CAD
TECHNIQUE: Bilateral screening digital craniocaudal and mediolateral oblique
mammograms were obtained. Bilateral screening digital breast
tomosynthesis was performed. The images were evaluated with
computer-aided detection.

[R MLO synth-2D]
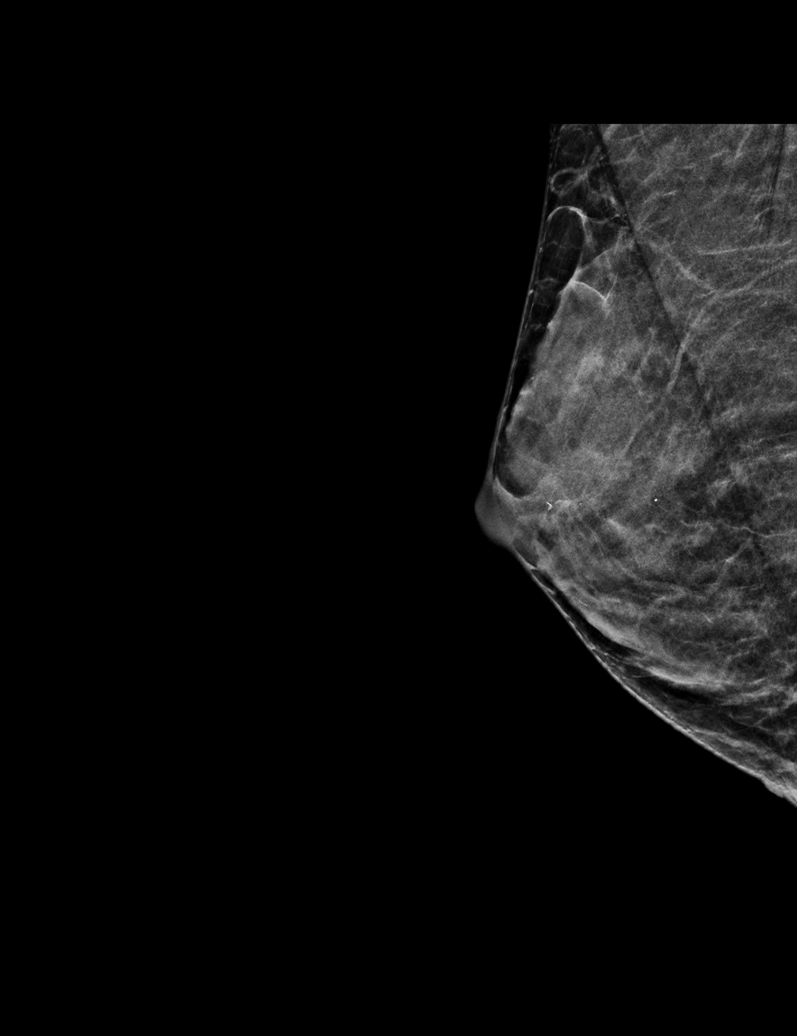

[R CC synth-2D (1 of 2)]
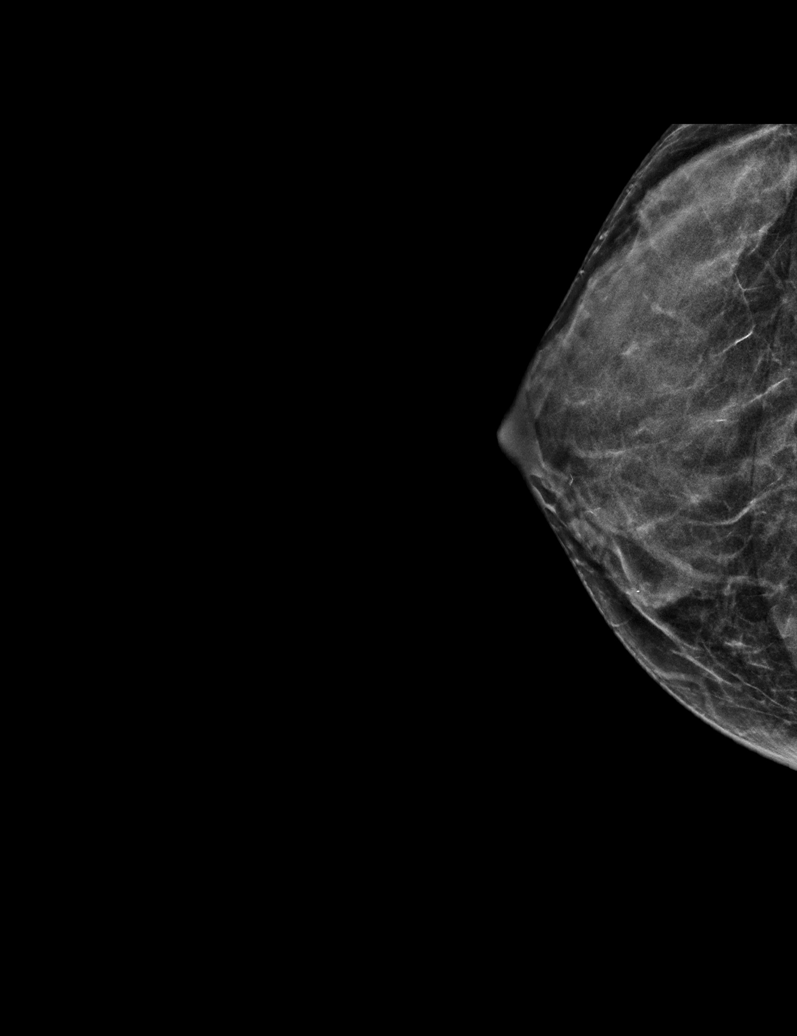

[L CC synth-2D]
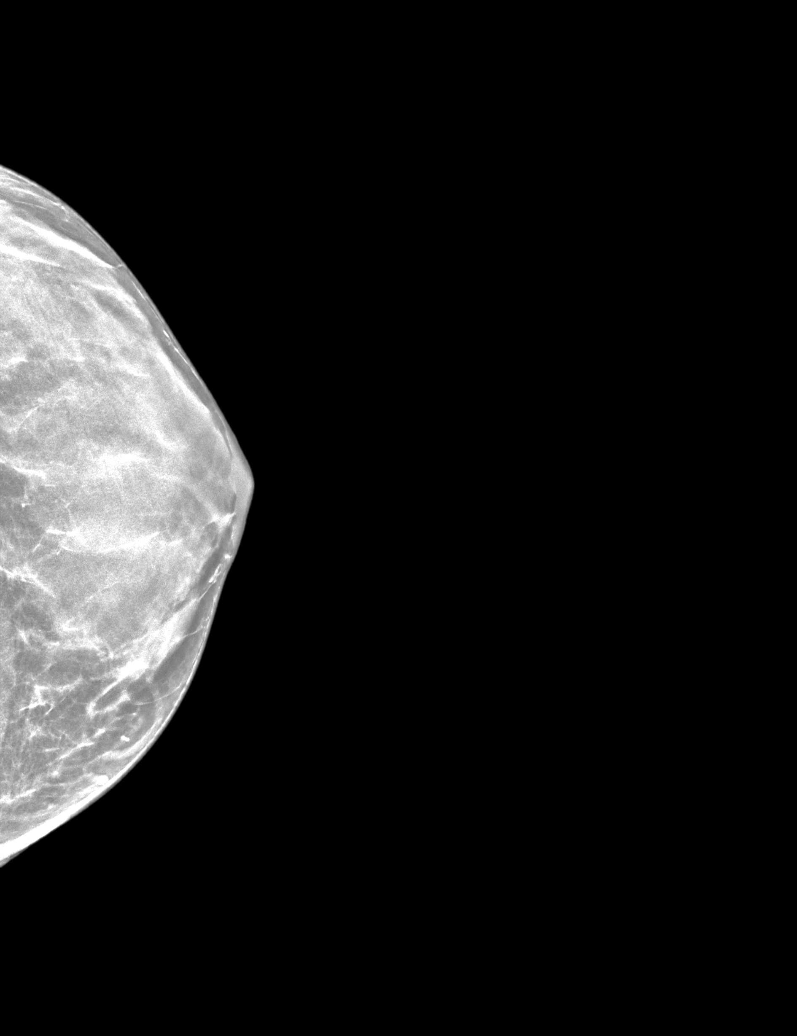

[L MLO synth-2D]
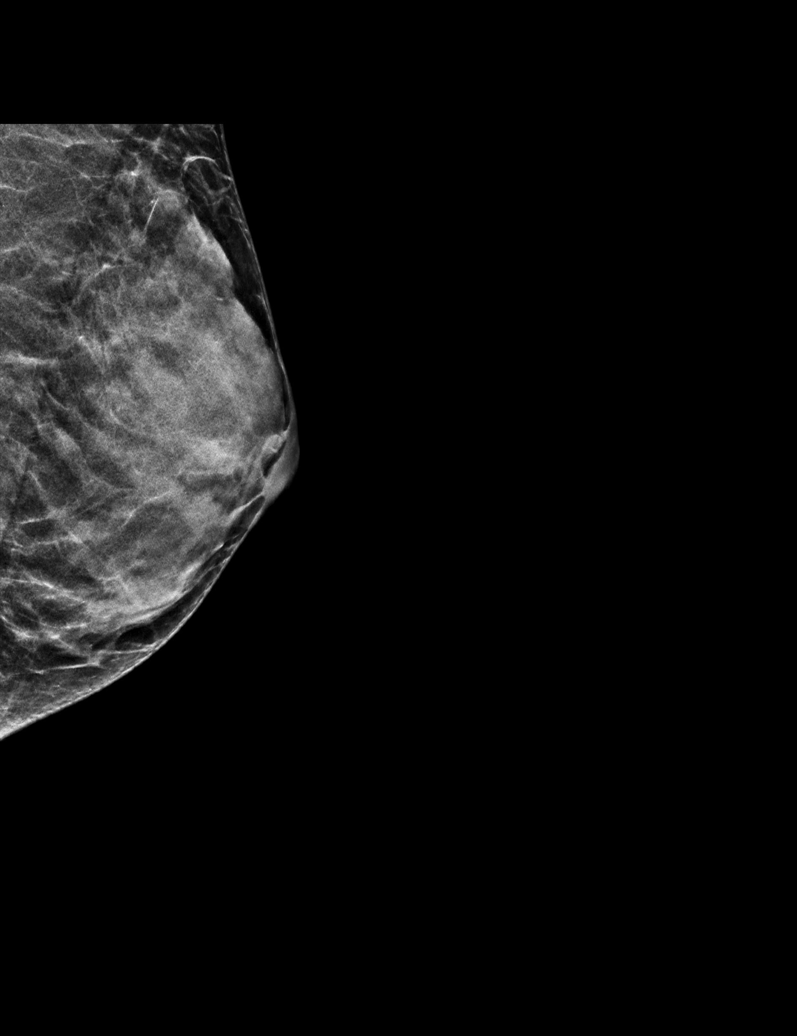

[R CC synth-2D (2 of 2)]
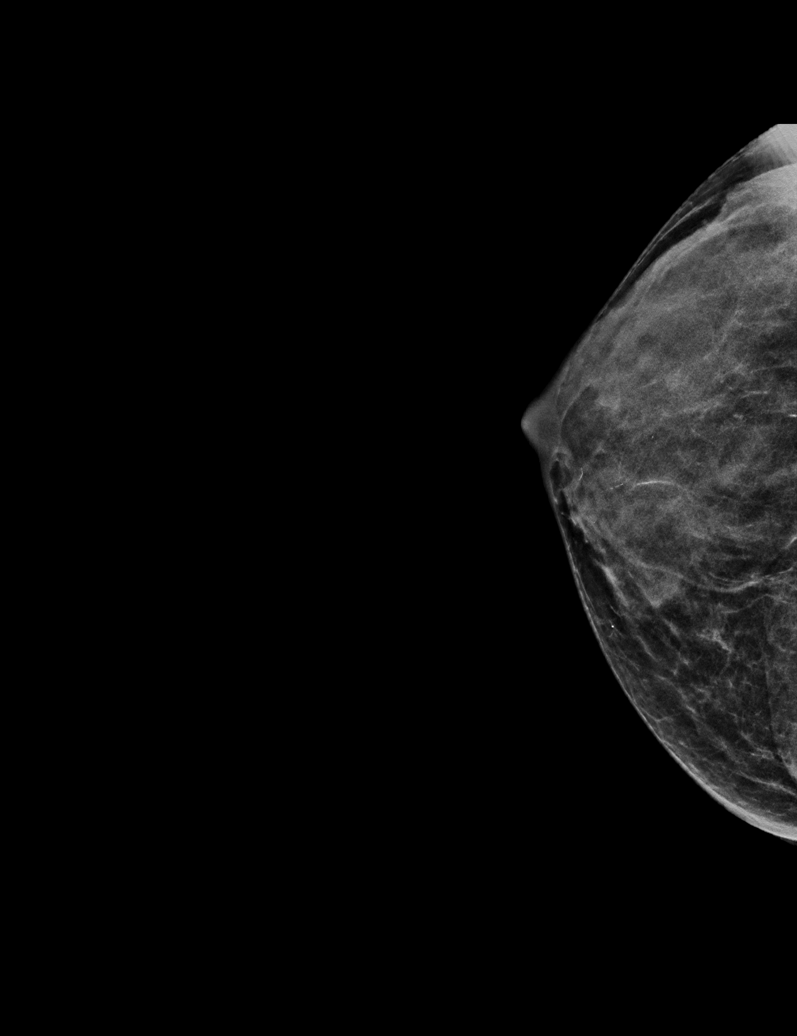

[L MLO tomo · tomo slice 19/36.0]
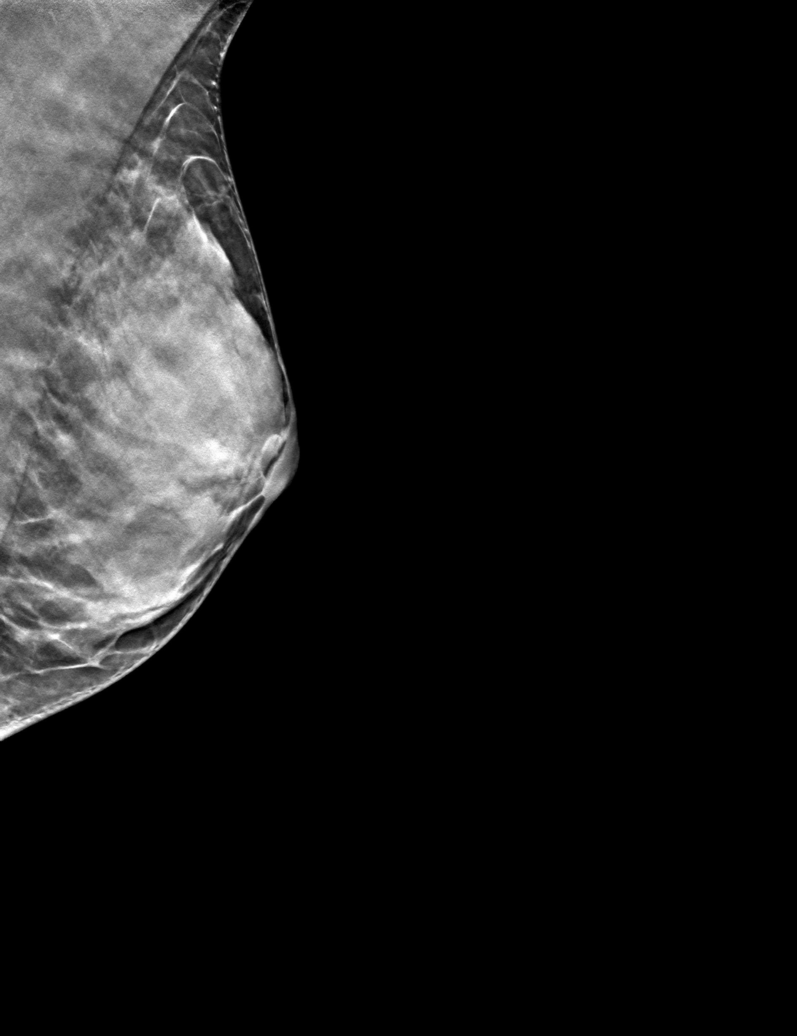

[6 of 30 positions shown; findings below may reference images not displayed]

ACR Breast Density Category d: The breast tissue is extremely dense,
which lowers the sensitivity of mammography.
FINDINGS: There are no findings suspicious for malignancy.
IMPRESSION: No mammographic evidence of malignancy. A result letter of this
screening mammogram will be mailed directly to the patient.

RECOMMENDATION:
Screening mammogram in one year. (Code:W3-Z-XIN)

BI-RADS CATEGORY  1: Negative.

## 2022-12-14 NOTE — Progress Notes (Signed)
42 y.o. G38P1102 Married White or Caucasian Not Hispanic or Latino female here for annual exam, discuss IUD. She has a mirena IUD that was inserted in 4/19. She doesn't typically bleed with the IUD. Over the last 6 months she has spotted 3 x for a couple of days. Just needs a panty liner. Sexually active, no pain.  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 Hasn't had an outbreak in 9 years. She does get rare oral HSV.    Patient's last menstrual period was 11/16/2022.          Sexually active: Yes.    The current method of family planning is IUD.    Exercising: no Smoker:  no  Health Maintenance: Pap:  12/28/20 wnl, negative Hr hpv  History of abnormal Pap:   Yes, patient has had colposcopy at 20, no surgery on her cervix.   MMG:  06/02/22 density D Bi-rads 1 neg  BMD:   none  Colonoscopy: none  TDaP:  02/22/23 Gardasil: complete    reports that she has never smoked. She has never used smokeless tobacco. She reports current alcohol use. She reports that she does not use drugs. Rare ETOH. 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    Past Medical History:  Diagnosis Date   Abnormal Pap smear    Acid reflux    Constipation    Hemorrhoids    Herpes    IBS (irritable bowel syndrome)     Past Surgical History:  Procedure Laterality Date   COLPOSCOPY     HEMORRHOID BANDING     WISDOM TOOTH EXTRACTION      Current Outpatient Medications  Medication Sig Dispense Refill   levonorgestrel (MIRENA, 52 MG,) 20 MCG/24HR IUD Mirena 20 mcg/24 hours (7 yrs) 52 mg intrauterine device  Take 1 device by intrauterine route.     Multiple Vitamin (MULTIVITAMIN) tablet Take 1 tablet by mouth daily.     tretinoin (RETIN-A) 0.1 % cream APPLY EXTERNALLY TO FACE EVERY NIGHT AT BEDTIME     No current facility-administered medications for this visit.    Family History  Problem  Relation Age of Onset   Clotting disorder Father    Arthritis Father    Alzheimer's disease Maternal Grandmother    Heart disease Maternal Grandfather    Diabetes Maternal Grandfather    Obesity Maternal Grandfather    Cancer Paternal Grandmother    Lung cancer Paternal Grandfather    Breast cancer Neg Hx     Review of Systems  All other systems reviewed and are negative.   Exam:   BP 110/68 (BP Location: Right Arm, Patient Position: Sitting, Cuff Size: Normal)   Pulse 74   Ht 5' 5.5" (1.664 m)   Wt 134 lb (60.8 kg)   LMP 11/16/2022 Comment: spotting  BMI 21.96 kg/m   Weight change: @WEIGHTCHANGE @ Height:   Height: 5' 5.5" (166.4 cm)  Ht Readings from Last 3 Encounters:  12/21/22 5' 5.5" (1.664 m)  01/05/22 5\' 6"  (1.676 m)  12/28/20 5' 5.5" (1.664 m)    General appearance: alert, cooperative and appears stated age Head: Normocephalic, without obvious abnormality, atraumatic Neck: no adenopathy, supple, symmetrical, trachea midline and thyroid normal to inspection and palpation Lungs: clear to auscultation bilaterally Cardiovascular: regular rate and rhythm Breasts: normal appearance, no masses or tenderness Abdomen: soft, non-tender; non distended,  no masses,  no  organomegaly Extremities: extremities normal, atraumatic, no cyanosis or edema Skin: Skin color, texture, turgor normal. No rashes or lesions Lymph nodes: Cervical, supraclavicular, and axillary nodes normal. No abnormal inguinal nodes palpated Neurologic: Grossly normal   Pelvic: External genitalia:  no lesions              Urethra:  normal appearing urethra with no masses, tenderness or lesions              Bartholins and Skenes: normal                 Vagina: normal appearing vagina with normal color and discharge, no lesions              Cervix: no lesions and tip of IUD string pulled out of the cervix with a cytobrush.                Bimanual Exam:  Uterus:  normal size, contour, position, consistency,  mobility, non-tender              Adnexa: no mass, fullness, tenderness               Rectovaginal: Confirms               Anus:  normal sphincter tone, no lesions   1. Well woman exam Discussed breast self exam Discussed calcium and vit D intake Labs with primary No pap this year Mammogram due in 6/24  2. IUD check up Doing well

## 2022-12-21 ENCOUNTER — Encounter: Payer: Self-pay | Admitting: Obstetrics and Gynecology

## 2022-12-21 ENCOUNTER — Ambulatory Visit (INDEPENDENT_AMBULATORY_CARE_PROVIDER_SITE_OTHER): Payer: BC Managed Care – PPO | Admitting: Obstetrics and Gynecology

## 2022-12-21 VITALS — BP 110/68 | HR 74 | Ht 65.5 in | Wt 134.0 lb

## 2022-12-21 DIAGNOSIS — Z01419 Encounter for gynecological examination (general) (routine) without abnormal findings: Secondary | ICD-10-CM

## 2022-12-21 DIAGNOSIS — Z30431 Encounter for routine checking of intrauterine contraceptive device: Secondary | ICD-10-CM

## 2022-12-21 NOTE — Patient Instructions (Signed)

## 2023-03-10 ENCOUNTER — Encounter: Payer: Self-pay | Admitting: Obstetrics and Gynecology

## 2023-04-18 ENCOUNTER — Other Ambulatory Visit: Payer: Self-pay | Admitting: Family Medicine

## 2023-04-18 DIAGNOSIS — Z1231 Encounter for screening mammogram for malignant neoplasm of breast: Secondary | ICD-10-CM

## 2023-06-14 ENCOUNTER — Telehealth: Payer: BC Managed Care – PPO | Admitting: Nurse Practitioner

## 2023-06-14 DIAGNOSIS — L247 Irritant contact dermatitis due to plants, except food: Secondary | ICD-10-CM | POA: Diagnosis not present

## 2023-06-14 MED ORDER — PREDNISONE 10 MG (21) PO TBPK: ORAL_TABLET | ORAL | 0 refills | Status: DC

## 2023-06-14 NOTE — Progress Notes (Signed)
E-Visit for Poison Ivy  We are sorry that you are not feeing well.  Here is how we plan to help!  Based on what you have shared with me it looks like you have had an allergic reaction to the oily resin from a group of plants.  This resin is very sticky, so it easily attaches to your skin, clothing, tools equipment, and pet's fur.    This blistering rash is often called poison ivy rash although it can come from contact with the leaves, stems and roots of poison ivy, poison oak and poison sumac.  The oily resin contains urushiol (u-ROO-she-ol) that produces a skin rash on exposed skin.  The severity of the rash depends on the amount of urushiol that gets on your skin.  A section of skin with more urushiol on it may develop a rash sooner.  The rash usually develops 12-48 hours after exposure and can last two to three weeks.  Your skin must come in direct contact with the plant's oil to be affected.  Blister fluid doesn't spread the rash.  However, if you come into contact with a piece of clothing or pet fur that has urushiol on it, the rash may spread out.  You can also transfer the oil to other parts of your body with your fingers.  Often the rash looks like a straight line because of the way the plant brushes against your skin.  Since your rash is widespread or has resulted in a large number of blisters, I have prescribed an oral corticosteroid.  Please follow these recommendations:  I have sent a prednisone dose pack to your chosen pharmacy. Be sure to follow the instructions carefully and complete the entire prescription. You may use Benadryl or Caladryl topical lotions to sooth the itch and remember cool, not hot, showers and baths can help relieve the itching!  Place cool, wet compresses on the affected area for 15-30 minutes several times a day.  You may also take oral antihistamines, such as diphenhydramine (Benadryl, others), which may also help you sleep better.  Watch your skin for any purulent  (pus) drainage or red streaking from the site.  If this occurs, contact your provider.  You may require an antibiotic for a skin infection.  Make sure that the clothes you were wearing as well as any towels or sheets that may have come in contact with the oil (urushiol) are washed in detergent and hot water.       I have developed the following plan to treat your condition I am prescribing a two week course of steroids (37 tablets of 10 mg prednisone).  Days 1-4 take 4 tablets (40 mg) daily  Days 5-8 take 3 tablets (30 mg) daily, Days 9-11 take 2 tablets (20 mg) daily, Days 12-14 take 1 tablet (10 mg) daily.    What can you do to prevent this rash?  Avoid the plants.  Learn how to identify poison ivy, poison oak and poison sumac in all seasons.  When hiking or engaging in other activities that might expose you to these plants, try to stay on cleared pathways.  If camping, make sure you pitch your tent in an area free of these plants.  Keep pets from running through wooded areas so that urushiol doesn't accidentally stick to their fur, which you may touch.  Remove or kill the plants.  In your yard, you can get rid of poison ivy by applying an herbicide or pulling it out of   the ground, including the roots, while wearing heavy gloves.  Afterward remove the gloves and thoroughly wash them and your hands.  Don't burn poison ivy or related plants because the urushiol can be carried by smoke.  Wear protective clothing.  If needed, protect your skin by wearing socks, boots, pants, long sleeves and vinyl gloves.  Wash your skin right away.  Washing off the oil with soap and water within 30 minutes of exposure may reduce your chances of getting a poison ivy rash.  Even washing after an hour or so can help reduce the severity of the rash.  If you walk through some poison ivy and then later touch your shoes, you may get some urushiol on your hands, which may then transfer to your face or body by touching or  rubbing.  If the contaminated object isn't cleaned, the urushiol on it can still cause a skin reaction years later.    Be careful not to reuse towels after you have washed your skin.  Also carefully wash clothing in detergent and hot water to remove all traces of the oil.  Handle contaminated clothing carefully so you don't transfer the urushiol to yourself, furniture, rugs or appliances.  Remember that pets can carry the oil on their fur and paws.  If you think your pet may be contaminated with urushiol, put on some long rubber gloves and give your pet a bath.  Finally, be careful not to burn these plants as the smoke can contain traces of the oil.  Inhaling the smoke may result in difficulty breathing. If that occurred you should see a physician as soon as possible.  See your doctor right away if:  The reaction is severe or widespread You inhaled the smoke from burning poison ivy and are having difficulty breathing Your skin continues to swell The rash affects your eyes, mouth or genitals Blisters are oozing pus You develop a fever greater than 100 F (37.8 C) The rash doesn't get better within a few weeks.  If you scratch the poison ivy rash, bacteria under your fingernails may cause the skin to become infected.  See your doctor if pus starts oozing from the blisters.  Treatment generally includes antibiotics.  Poison ivy treatments are usually limited to self-care methods.  And the rash typically goes away on its own in two to three weeks.     If the rash is widespread or results in a large number of blisters, your doctor may prescribe an oral corticosteroid, such as prednisone.  If a bacterial infection has developed at the rash site, your doctor may give you a prescription for an oral antibiotic.  MAKE SURE YOU  Understand these instructions. Will watch your condition. Will get help right away if you are not doing well or get worse.   Thank you for choosing an e-visit.  Your  e-visit answers were reviewed by a board certified advanced clinical practitioner to complete your personal care plan. Depending upon the condition, your plan could have included both over the counter or prescription medications.  Please review your pharmacy choice. Make sure the pharmacy is open so you can pick up prescription now. If there is a problem, you may contact your provider through MyChart messaging and have the prescription routed to another pharmacy.  Your safety is important to us. If you have drug allergies check your prescription carefully.   For the next 24 hours you can use MyChart to ask questions about today's visit, request a non-urgent   call back, or ask for a work or school excuse. You will get an email in the next two days asking about your experience. I hope that your e-visit has been valuable and will speed your recovery.   Mary-Margaret Cole Klugh, FNP   5-10 minutes spent reviewing and documenting in chart.  

## 2023-06-14 NOTE — Addendum Note (Signed)
Addended by: Bennie Pierini on: 06/14/2023 01:12 PM   Modules accepted: Level of Service

## 2023-06-19 ENCOUNTER — Ambulatory Visit
Admission: RE | Admit: 2023-06-19 | Discharge: 2023-06-19 | Disposition: A | Discharge status: Home / Self Care | Payer: BC Managed Care – PPO | Source: Ambulatory Visit

## 2023-06-19 DIAGNOSIS — Z1231 Encounter for screening mammogram for malignant neoplasm of breast: Secondary | ICD-10-CM

## 2024-02-14 ENCOUNTER — Encounter: Payer: Self-pay | Admitting: Family Medicine

## 2024-03-06 NOTE — Patient Instructions (Signed)
 Good to see you again today

## 2024-03-06 NOTE — Progress Notes (Unsigned)
 [facility] [address] [place], [place] [address] [phone]  Date:  03/07/2024   Name:  Bianca Webster   DOB:  12/19/1980   MRN:  [id number]  PCP:  Bianca Cables, MD    Chief Complaint: No chief complaint on file.   History of Present Illness:  Bianca Webster is a 43 y.o. very pleasant female patient who presents with the following:  Pt seen today for CPE Last visit with myself about 2 years ago  She does have GYN care with Dr Bianca Webster, last visit just over a year ago   Married to Bianca Webster, her daughters are 40 and 14 Pap Mammo 7/24 Labs can be updated today Tetanus may need to be updated HPV done   IUD  Never a smoker   There are no active problems to display for this patient.   Past Medical History:  Diagnosis Date   Abnormal Pap smear    Acid reflux    Constipation    Hemorrhoids    Herpes    IBS (irritable bowel syndrome)     Past Surgical History:  Procedure Laterality Date   COLPOSCOPY     HEMORRHOID BANDING     WISDOM TOOTH EXTRACTION      Social History   Tobacco Use   Smoking status: Never   Smokeless tobacco: Never  Substance Use Topics   Alcohol use: Yes    Comment: rare   Drug use: No    Family History  Problem Relation Age of Onset   Clotting disorder Father    Arthritis Father    Alzheimer's disease Maternal Grandmother    Heart disease Maternal Grandfather    Diabetes Maternal Grandfather    Obesity Maternal Grandfather    Cancer Paternal Grandmother    Lung cancer Paternal Grandfather    Breast cancer Neg Hx     No Known Allergies  Medication list has been reviewed and updated.  Current Outpatient Medications on File Prior to Visit  Medication Sig Dispense Refill   levonorgestrel (MIRENA, 52 MG,) 20 MCG/24HR IUD Mirena 20 mcg/24 hours (7 yrs) 52 mg intrauterine device  Take 1 device by intrauterine route.     Multiple Vitamin (MULTIVITAMIN) tablet  Take 1 tablet by mouth daily.     predniSONE (STERAPRED UNI-PAK 21 TAB) 10 MG (21) TBPK tablet Days 1-4 take 4 tablets (40 mg) daily  Days 5-8 take 3 tablets (30 mg) daily, Days 9-11 take 2 tablets (20 mg) daily, Days 12-14 take 1 tablet (10 mg) daily. 37 tablet 0   tretinoin (RETIN-A) 0.1 % cream APPLY EXTERNALLY TO FACE EVERY NIGHT AT BEDTIME     No current facility-administered medications on file prior to visit.    Review of Systems:  As per HPI- otherwise negative.   Physical Examination: There were no vitals filed for this visit. There were no vitals filed for this visit. There is no height or weight on file to calculate BMI. Ideal Body Weight:    GEN: no acute distress. HEENT: Atraumatic, Normocephalic.  Ears and Nose: No external deformity. CV: RRR, No M/G/R. No JVD. No thrill. No extra heart sounds. PULM: CTA B, no wheezes, crackles, rhonchi. No retractions. No resp. distress. No accessory muscle use. ABD: S, NT, ND, +BS. No rebound. No HSM. EXTR: No c/c/e PSYCH: Normally interactive. Conversant.    Assessment and Plan: *** Physical exam today- recommended healthy diet and exercise  routine Will plan further follow- up pending labs.  Signed Bianca Amsterdam, MD

## 2024-03-07 ENCOUNTER — Ambulatory Visit: Payer: Self-pay | Admitting: Family Medicine

## 2024-03-07 ENCOUNTER — Encounter: Payer: Self-pay | Admitting: Family Medicine

## 2024-03-07 VITALS — BP 124/62 | HR 58 | Temp 97.6°F | Resp 18 | Ht 66.0 in | Wt 135.8 lb

## 2024-03-07 DIAGNOSIS — Z13220 Encounter for screening for lipoid disorders: Secondary | ICD-10-CM

## 2024-03-07 DIAGNOSIS — Z0000 Encounter for general adult medical examination without abnormal findings: Secondary | ICD-10-CM

## 2024-03-07 DIAGNOSIS — Z1329 Encounter for screening for other suspected endocrine disorder: Secondary | ICD-10-CM

## 2024-03-07 DIAGNOSIS — Z23 Encounter for immunization: Secondary | ICD-10-CM

## 2024-03-07 DIAGNOSIS — Z130 Encounter for screening for diseases of the blood and blood-forming organs and certain disorders involving the immune mechanism: Secondary | ICD-10-CM | POA: Diagnosis not present

## 2024-03-07 DIAGNOSIS — Z131 Encounter for screening for diabetes mellitus: Secondary | ICD-10-CM | POA: Diagnosis not present

## 2024-03-07 LAB — LIPID PANEL
Cholesterol: 161 mg/dL (ref 0–200)
HDL: 71.9 mg/dL (ref >39.00)
LDL Cholesterol: 82 mg/dL (ref 0–99)
NonHDL: 89.22
Total CHOL/HDL Ratio: 2
Triglycerides: 38 mg/dL (ref 0.0–149.0)
VLDL: 7.6 mg/dL (ref 0.0–40.0)

## 2024-03-07 LAB — COMPREHENSIVE METABOLIC PANEL WITH GFR
ALT: 7 U/L (ref 0–35)
AST: 13 U/L (ref 0–37)
Albumin: 4.7 g/dL (ref 3.5–5.2)
Alkaline Phosphatase: 41 U/L (ref 39–117)
BUN: 16 mg/dL (ref 6–23)
CO2: 27 meq/L (ref 19–32)
Calcium: 9 mg/dL (ref 8.4–10.5)
Chloride: 104 meq/L (ref 96–112)
Creatinine, Ser: 0.76 mg/dL (ref 0.40–1.20)
GFR: 95.65 mL/min (ref >60.00)
Glucose, Bld: 88 mg/dL (ref 70–99)
Potassium: 4.7 meq/L (ref 3.5–5.1)
Sodium: 136 meq/L (ref 135–145)
Total Bilirubin: 0.6 mg/dL (ref 0.2–1.2)
Total Protein: 7 g/dL (ref 6.0–8.3)

## 2024-03-07 LAB — CBC
HCT: 41.1 % (ref 36.0–46.0)
Hemoglobin: 13.6 g/dL (ref 12.0–15.0)
MCHC: 33.1 g/dL (ref 30.0–36.0)
MCV: 100.6 fl — ABNORMAL HIGH (ref 78.0–100.0)
Platelets: 238 10*3/uL (ref 150.0–400.0)
RBC: 4.09 Mil/uL (ref 3.87–5.11)
RDW: 12.1 % (ref 11.5–15.5)
WBC: 5.8 10*3/uL (ref 4.0–10.5)

## 2024-03-07 LAB — HEMOGLOBIN A1C: Hgb A1c MFr Bld: 5 % (ref 4.6–6.5)

## 2024-03-07 LAB — TSH: TSH: 1.35 u[IU]/mL (ref 0.35–5.50)

## 2024-03-14 ENCOUNTER — Ambulatory Visit: Payer: 59 | Admitting: Obstetrics and Gynecology

## 2024-03-20 ENCOUNTER — Encounter: Payer: Self-pay | Admitting: Family

## 2024-05-20 ENCOUNTER — Other Ambulatory Visit: Payer: Self-pay | Admitting: Family Medicine

## 2024-05-20 DIAGNOSIS — Z0000 Encounter for general adult medical examination without abnormal findings: Secondary | ICD-10-CM

## 2024-06-04 NOTE — Progress Notes (Unsigned)
 43 y.o. [name] Married Caucasian female here for annual exam.  Would like to discuss removing and reinserting IUD - Mirena inserted 2019.  Periods are coming back, and she would like to exchange her Mirena IUD.  Not having HSV outbreaks orally or genitally.   Sees her PCP for routine blood work.  Middle school counselor for 21 years.  Has 2 daughters.    PCP: Watt Harlene BROCKS, MD   Patient's last menstrual period was 06/03/2024 (exact date).     Period Cycle (Days): 28 Period Duration (Days): 2-3 Period Pattern: Regular Menstrual Flow: Light (spotting-light bleeding) Menstrual Control: Panty liner Dysmenorrhea: None     Sexually active: Yes.    The current method of family planning is IUD, Mirena 2019.     Menopausal hormone therapy:  n/a Exercising: Yes.    Walking Smoker:  no  OB History  Gravida Para Term Preterm AB Living  2 2 1 1  2   SAB IAB Ectopic Multiple Live Births      1    # Outcome Date GA Lbr Len/2nd Weight Sex Type Anes PTL Lv  2 Preterm 02/20/13 [redacted]w[redacted]d 05:20 / 00:50 4 lb 14 oz (2.21 kg) F Vag-Spont EPI  LIV  1 Term              HEALTH MAINTENANCE: Last 2 paps:  12/28/20 neg HR HPV neg, 03/26/13 neg  History of abnormal Pap or positive HPV:  yes.  Remote history of abnormal pap and did colposcopy.  Mammogram:   06/19/23 Breast Density Cat D, BIRADS Cat 1 neg.  Has appointment.  Colonoscopy:  n/a Bone Density:  06/10/22  Result  normal    Immunization History  Administered Date(s) Administered   HPV 9-valent 06/03/2021, 07/14/2021, 05/20/2022   Influenza Inj Mdck Quad Pf 09/19/2018   Influenza Split 09/04/2012   Influenza,inj,quad, With Preservative 09/19/2018, 08/30/2019   Influenza-Unspecified 09/04/2012, 09/21/2020, 09/28/2020, 10/03/2021, 09/29/2023   PFIZER(Purple Top)SARS-COV-2 Vaccination 02/06/2020, 02/27/2020   Td 03/07/2024   Tdap 02/21/2013      reports that she has never smoked. She has never used smokeless tobacco. She reports  current alcohol use. She reports that she does not use drugs.  Past Medical History:  Diagnosis Date   Abnormal Pap smear    Acid reflux    Constipation    Hemorrhoids    Herpes    IBS (irritable bowel syndrome)     Past Surgical History:  Procedure Laterality Date   COLPOSCOPY     HEMORRHOID BANDING     WISDOM TOOTH EXTRACTION      Current Outpatient Medications  Medication Sig Dispense Refill   Collagen-Vitamin C-Biotin (COLLAGEN PO) Take by mouth.     levonorgestrel (MIRENA, 52 MG,) 20 MCG/24HR IUD Mirena 20 mcg/24 hours (7 yrs) 52 mg intrauterine device  Take 1 device by intrauterine route.     MAGNESIUM PO Take by mouth.     Multiple Vitamin (MULTIVITAMIN) tablet Take 1 tablet by mouth daily.     tretinoin (RETIN-A) 0.1 % cream APPLY EXTERNALLY TO FACE EVERY NIGHT AT BEDTIME     No current facility-administered medications for this visit.    ALLERGIES: Patient has no known allergies.  Family History  Problem Relation Age of Onset   Clotting disorder Father    Arthritis Father    Alzheimer's disease Maternal Grandmother    Heart disease Maternal Grandfather    Diabetes Maternal Grandfather    Obesity Maternal Grandfather    Cancer  Paternal Grandmother    Lung cancer Paternal Grandfather    Breast cancer Neg Hx     Review of Systems  All other systems reviewed and are negative.   PHYSICAL EXAM:  BP 110/68 (BP Location: Left Arm, Patient Position: Sitting)   Pulse 81   Ht 5' 6.25 (1.683 m)   Wt 134 lb (60.8 kg)   LMP 06/03/2024 (Exact Date)   SpO2 97%   BMI 21.47 kg/m     General appearance: alert, cooperative and appears stated age Head: normocephalic, without obvious abnormality, atraumatic Neck: no adenopathy, supple, symmetrical, trachea midline and thyroid  normal to inspection and palpation Lungs: clear to auscultation bilaterally Breasts: normal appearance, no masses or tenderness, No nipple retraction or dimpling, No nipple discharge or  bleeding, No axillary adenopathy Heart: regular rate and rhythm Abdomen: soft, non-tender; no masses, no organomegaly Extremities: extremities normal, atraumatic, no cyanosis or edema Skin: skin color, texture, turgor normal. No rashes or lesions Lymph nodes: cervical, supraclavicular, and axillary nodes normal. Neurologic: grossly normal  Pelvic: External genitalia:  no lesions              No abnormal inguinal nodes palpated.              Urethra:  normal appearing urethra with no masses, tenderness or lesions              Bartholins and Skenes: normal                 Vagina: normal appearing vagina with normal color and discharge, no lesions              Cervix: no lesions.  IUD strings noted.               Pap taken: no Bimanual Exam:  Uterus:  normal size, contour, position, consistency, mobility, non-tender              Adnexa: no mass, fullness, tenderness              Rectal exam: yes.  Confirms.              Anus:  normal sphincter tone, no lesions  Chaperone was present for exam:  Kari HERO, CMA  ASSESSMENT: Well woman visit with gynecologic exam. Mirena IUD.  Placed 2019.   Periods are returning.  Hx HSV I.  Oral and genital.  Not having outbreaks. PHQ-2-9: 0  PLAN: Mammogram screening discussed. Self breast awareness reviewed. Pap and HRV collected:  no.  Due in 2027. Guidelines for Calcium , Vitamin D , regular exercise program including cardiovascular and weight bearing exercise. Medication refills:  NA Return for IUD exchange.  She may want a paracervical block.  Follow up:  yearly and prn.

## 2024-06-05 ENCOUNTER — Encounter: Payer: Self-pay | Admitting: Obstetrics and Gynecology

## 2024-06-05 ENCOUNTER — Ambulatory Visit (INDEPENDENT_AMBULATORY_CARE_PROVIDER_SITE_OTHER): Admitting: Obstetrics and Gynecology

## 2024-06-05 VITALS — BP 110/68 | HR 81 | Ht 66.25 in | Wt 134.0 lb

## 2024-06-05 DIAGNOSIS — Z30431 Encounter for routine checking of intrauterine contraceptive device: Secondary | ICD-10-CM

## 2024-06-05 DIAGNOSIS — Z01419 Encounter for gynecological examination (general) (routine) without abnormal findings: Secondary | ICD-10-CM | POA: Diagnosis not present

## 2024-06-05 DIAGNOSIS — Z1331 Encounter for screening for depression: Secondary | ICD-10-CM

## 2024-06-05 NOTE — Patient Instructions (Signed)

## 2024-06-17 ENCOUNTER — Other Ambulatory Visit: Payer: Self-pay | Admitting: Family Medicine

## 2024-06-17 DIAGNOSIS — Z1231 Encounter for screening mammogram for malignant neoplasm of breast: Secondary | ICD-10-CM

## 2024-06-19 ENCOUNTER — Ambulatory Visit
Admission: RE | Admit: 2024-06-19 | Discharge: 2024-06-19 | Disposition: A | Discharge status: Home / Self Care | Source: Ambulatory Visit | Attending: Family Medicine | Admitting: Family Medicine

## 2024-06-19 DIAGNOSIS — Z0000 Encounter for general adult medical examination without abnormal findings: Secondary | ICD-10-CM

## 2024-06-25 ENCOUNTER — Encounter

## 2024-07-17 ENCOUNTER — Encounter: Payer: Self-pay | Admitting: Obstetrics and Gynecology

## 2024-07-17 ENCOUNTER — Ambulatory Visit: Admitting: Obstetrics and Gynecology

## 2024-07-17 VITALS — BP 112/76 | HR 92

## 2024-07-17 DIAGNOSIS — Z30433 Encounter for removal and reinsertion of intrauterine contraceptive device: Secondary | ICD-10-CM

## 2024-07-17 DIAGNOSIS — Z30431 Encounter for routine checking of intrauterine contraceptive device: Secondary | ICD-10-CM

## 2024-07-17 LAB — PREGNANCY, URINE: Preg Test, Ur: NEGATIVE

## 2024-07-17 NOTE — Progress Notes (Signed)
 GYNECOLOGY  VISIT   HPI: 43 y.o.   Married  Caucasian female   [id number] with No LMP recorded. (Menstrual status: IUD).   here for: IUD exchange - Mirena     Thinks this is her third IUD.   Husband will have a future vasectomy.    UPT negative today.  GYNECOLOGIC HISTORY: No LMP recorded. (Menstrual status: IUD). Contraception:  IUD - Mirena  2019 Menopausal hormone therapy:  n/a Last 2 paps:  12/28/20 neg HR HPV neg, 03/26/13 neg  History of abnormal Pap or positive HPV:  no Mammogram:  06/19/24 Breast Density Cat D, BIRADS Cat 1 neg         OB History     Gravida  2   Para  2   Term  1   Preterm  1   AB      Living  2      SAB      IAB      Ectopic      Multiple      Live Births  1              There are no active problems to display for this patient.   Past Medical History:  Diagnosis Date   Abnormal Pap smear    Acid reflux    Constipation    Hemorrhoids    Herpes    IBS (irritable bowel syndrome)     Past Surgical History:  Procedure Laterality Date   COLPOSCOPY     HEMORRHOID BANDING     WISDOM TOOTH EXTRACTION      Current Outpatient Medications  Medication Sig Dispense Refill   Collagen-Vitamin C-Biotin (COLLAGEN PO) Take by mouth.     levonorgestrel  (MIRENA , 52 MG,) 20 MCG/24HR IUD Mirena  20 mcg/24 hours (7 yrs) 52 mg intrauterine device  Take 1 device by intrauterine route.     MAGNESIUM PO Take by mouth.     Multiple Vitamin (MULTIVITAMIN) tablet Take 1 tablet by mouth daily.     tretinoin (RETIN-A) 0.1 % cream APPLY EXTERNALLY TO FACE EVERY NIGHT AT BEDTIME     No current facility-administered medications for this visit.     ALLERGIES: Patient has no known allergies.  Family History  Problem Relation Age of Onset   Clotting disorder Father    Arthritis Father    Alzheimer's disease Maternal Grandmother    Heart disease Maternal Grandfather    Diabetes Maternal Grandfather    Obesity Maternal Grandfather    Cancer  Paternal Grandmother    Lung cancer Paternal Grandfather    Breast cancer Neg Hx     Social History   Socioeconomic History   Marital status: Married    Spouse name: Not on file   Number of children: Not on file   Years of education: Not on file   Highest education level: Master's degree (e.g., MA, MS, MEng, MEd, MSW, MBA)  Occupational History   Not on file  Tobacco Use   Smoking status: Never   Smokeless tobacco: Never  Substance and Sexual Activity   Alcohol use: Yes    Comment: rare   Drug use: No   Sexual activity: Yes    Partners: Male    Birth control/protection: I.U.D.    Comment: Mirena  inserted 2019  Other Topics Concern   Not on file  Social History Narrative   Not on file   Social Drivers of Health   Financial Resource Strain: Low Risk  (02/29/2024)  Overall Financial Resource Strain (CARDIA)    Difficulty of Paying Living Expenses: Not hard at all  Food Insecurity: No Food Insecurity (02/29/2024)   Hunger Vital Sign    Worried About Running Out of Food in the Last Year: Never true    Ran Out of Food in the Last Year: Never true  Transportation Needs: No Transportation Needs (02/29/2024)   PRAPARE - Administrator, Civil Service (Medical): No    Lack of Transportation (Non-Medical): No  Physical Activity: Sufficiently Active (02/29/2024)   Exercise Vital Sign    Days of Exercise per Week: 5 days    Minutes of Exercise per Session: 30 min  Stress: No Stress Concern Present (02/29/2024)   [employer] of Occupational Health - Occupational Stress Questionnaire    Feeling of Stress : Not at all  Social Connections: Moderately Isolated (02/29/2024)   Social Connection and Isolation Panel    Frequency of Communication with Friends and Family: Twice a week    Frequency of Social Gatherings with Friends and Family: Twice a week    Attends Religious Services: Never    Database administrator or Organizations: No    Attends Hospital doctor: Not on file    Marital Status: Married  Catering manager Violence: Not on file    Review of Systems  All other systems reviewed and are negative.   PHYSICAL EXAMINATION:   BP 112/76 (BP Location: Left Arm, Patient Position: Sitting)   Pulse 92   SpO2 98%     General appearance: alert, cooperative and appears stated age    Pelvic: External genitalia:  no lesions              Urethra:  normal appearing urethra with no masses, tenderness or lesions              Bartholins and Skenes: normal                 Vagina: normal appearing vagina with normal color and discharge, no lesions              Cervix: no lesions                Bimanual Exam:  Uterus:  normal size, contour, position, consistency, mobility, non-tender              Adnexa: no mass, fullness, tenderness            Mirena  IUD exchange.  New Mirena  IUD lot [id number], exp Jun 2027 Consent and time out done.  Sterile prep with Hibiclens.  Paracervical block with 10 cc 1% lidocaine , lot [id number], exp Sept 2026.  Tenaculum to anterior cervical lip. Ring forceps used to remove IUD intact, shown to patient and discarded. Uterus sounded to 7.5 cm.  Mirena  placed without difficulty.  Strings trimmed and declined to be felt by patient.  No complications.  Minimal EBL. Repeat bimanual exam, no change.   Chaperone was present for exam:  Kari HERO, CMA  ASSESSMENT:  Mirena  IUD exchange.   PLAN:  Back up protection for 1 week.  IUD card to patient.  FU in 4 weeks.

## 2024-07-17 NOTE — Patient Instructions (Signed)
 Intrauterine Device (IUD) Insertion: What to Expect  An intrauterine device (IUD) is put in (inserted) your uterus to prevent pregnancy. It's a small, T-shaped device that has one or two nylon strings hanging down from it. The strings hang out of your cervix, which is the lowest part of your uterus. Tell a health care provider about: Any allergies you have. All medicines you take. These include vitamins, herbs, eye drops, and creams. Any surgeries you have had. Any medical problems you have. Whether you're pregnant or may be pregnant. What are the risks? Your health care provider will talk with you about risks. These may include: Infection. Bleeding. Allergic reactions to medicines. A cut to the uterus, also called perforation, or damage to other structures or organs. Accidental placement of the IUD either in the muscle layer of the uterus or outside the uterus. The IUD falling out of the uterus. This is more common if you recently had a baby. Higher risk of ectopic pregnancy. This is when an egg is fertilized outside your uterus. This is rare. Pelvic inflammatory disease (PID). This is an infection in the uterus and fallopian tubes. The IUD doesn't cause the infection. The infection is usually from a sexually transmitted infection (STI). If this happens, it is usually during the first 20 days after the IUD is put in. This is rare. What happens before? Ask about changing or stopping: Any medicines you take. Any vitamins, herbs, or supplements you take. Your provider may tell you to take pain medicines you can buy at the store before the procedure. You may have tests for: Pregnancy. You may have a pee (urine) or blood sample taken. STIs. Placing an IUD can make an infection worse. To check for cervical cancer. You may have a Pap test, which is when cells from your cervix are removed for testing. What happens during an IUD insertion? A tool, called a speculum, will be placed in your  vagina and widened so that your provider can see your cervix. A medicine to clean your cervix may be used to help lower your risk of infection. You may be given medicine to numb your cervix. This medicine is usually given by an injection into your cervix. A tool will be put into your uterus to check the length of your uterus. A thin tube that holds the IUD will be put into your vagina, through the opening of your cervix, and into your uterus. The IUD will be placed in your uterus. The tube that holds the IUD will be removed. The strings that are attached to the IUD will be trimmed so that they sit just outside your cervix. The speculum will be removed. These steps may vary. Ask what you can expect. What happens after? You may have: Bleeding. It can vary from light bleeding or spotting for a few days to period-like bleeding. This is normal. Cramps and pain in your belly. Dizziness or light-headedness. Pain in your lower back. Headaches and the feeling like you may throw up Follow these instructions at home: Do not have sex or put anything into your vagina for 24 hours after the IUD is placed. Before having sex, check to make sure that you can feel the IUD string or strings. You should be able to feel the end of the string below the opening of your cervix. If your IUD string is in place, you may continue with sex. If you had a hormonal IUD put in more than 7 days after your most recent period  started, you will need to use a backup method of birth control for 7 days after the IUD was placed. Hormones are chemicals that affect how the body works. Check that the IUD is still in place by feeling for the strings after every period, or check once a month. Use a condom every time you have sex to prevent STIs. An IUD won't protect you from STIs. Take your medicines only as told. Contact a health care provider if: You have any of the following problems with your IUD string or strings: The string  bothers or hurts you or your sexual partner. You can't feel the string. The string has gotten longer. The IUD comes out or you can feel the IUD in your vagina. You think you may be pregnant, or you miss your period. You think you may have an STI. You have bad-smelling discharge from your vagina. You have a fever and chills. You have pain during sex. Get help right away if: You have heavy bleeding, which means soaking more than 2 pads per hour for 2 hours in a row. You have sudden, really bad belly pain. This information is not intended to replace advice given to you by your health care provider. Make sure you discuss any questions you have with your health care provider. Document Revised: 07/31/2023 Document Reviewed: 07/31/2023 Elsevier Patient Education  2024 [employer].

## 2024-09-10 ENCOUNTER — Ambulatory Visit: Admitting: Obstetrics and Gynecology

## 2024-09-11 ENCOUNTER — Ambulatory Visit: Admitting: Obstetrics and Gynecology

## 2025-03-10 ENCOUNTER — Encounter: Admitting: Family Medicine

## 2025-06-10 ENCOUNTER — Ambulatory Visit: Admitting: Obstetrics and Gynecology

## 2025-06-16 ENCOUNTER — Ambulatory Visit: Admitting: Obstetrics and Gynecology
